# Patient Record
Sex: Female | Born: 1980 | Race: White | Hispanic: No | Marital: Married | Smoking: Former smoker
Health system: Southern US, Community
[De-identification: ages and names within clinical notes are randomized; demographics above are authoritative.]

## PROBLEM LIST (undated history)

## (undated) DIAGNOSIS — G43909 Migraine, unspecified, not intractable, without status migrainosus: Secondary | ICD-10-CM

## (undated) DIAGNOSIS — K589 Irritable bowel syndrome without diarrhea: Secondary | ICD-10-CM

## (undated) DIAGNOSIS — G629 Polyneuropathy, unspecified: Secondary | ICD-10-CM

## (undated) DIAGNOSIS — L900 Lichen sclerosus et atrophicus: Secondary | ICD-10-CM

## (undated) DIAGNOSIS — K219 Gastro-esophageal reflux disease without esophagitis: Secondary | ICD-10-CM

## (undated) DIAGNOSIS — G8929 Other chronic pain: Secondary | ICD-10-CM

## (undated) DIAGNOSIS — M549 Dorsalgia, unspecified: Secondary | ICD-10-CM

## (undated) HISTORY — DX: Dorsalgia, unspecified: M54.9

## (undated) HISTORY — DX: Other chronic pain: G89.29

## (undated) HISTORY — DX: Lichen sclerosus et atrophicus: L90.0

## (undated) HISTORY — DX: Irritable bowel syndrome, unspecified: K58.9

## (undated) HISTORY — DX: Migraine, unspecified, not intractable, without status migrainosus: G43.909

## (undated) HISTORY — DX: Gastro-esophageal reflux disease without esophagitis: K21.9

## (undated) HISTORY — DX: Polyneuropathy, unspecified: G62.9

---

## 2006-08-19 ENCOUNTER — Emergency Department (HOSPITAL_COMMUNITY): Admission: EM | Admit: 2006-08-19 | Discharge: 2006-08-19 | Payer: Self-pay | Admitting: Emergency Medicine

## 2014-11-30 HISTORY — PX: LUMBAR DISC SURGERY: SHX700

## 2015-11-23 ENCOUNTER — Other Ambulatory Visit: Payer: Self-pay | Admitting: Neurosurgery

## 2015-11-23 DIAGNOSIS — M545 Low back pain: Secondary | ICD-10-CM

## 2015-12-05 ENCOUNTER — Ambulatory Visit
Admission: RE | Admit: 2015-12-05 | Discharge: 2015-12-05 | Disposition: A | Discharge status: Home / Self Care | Payer: Federal, State, Local not specified - PPO | Source: Ambulatory Visit | Attending: Neurosurgery | Admitting: Neurosurgery

## 2015-12-05 DIAGNOSIS — M545 Low back pain: Secondary | ICD-10-CM

## 2015-12-05 MED ORDER — GADOBENATE DIMEGLUMINE 529 MG/ML IV SOLN
18.0000 mL | Freq: Once | INTRAVENOUS | Status: AC | PRN
  Administered 2015-12-05: 18 mL via INTRAVENOUS

## 2017-01-14 DIAGNOSIS — K080 Exfoliation of teeth due to systemic causes: Secondary | ICD-10-CM | POA: Diagnosis not present

## 2017-04-10 DIAGNOSIS — M545 Low back pain: Secondary | ICD-10-CM | POA: Diagnosis not present

## 2017-04-10 DIAGNOSIS — M5416 Radiculopathy, lumbar region: Secondary | ICD-10-CM | POA: Diagnosis not present

## 2017-04-10 DIAGNOSIS — M5417 Radiculopathy, lumbosacral region: Secondary | ICD-10-CM | POA: Diagnosis not present

## 2017-04-10 DIAGNOSIS — R030 Elevated blood-pressure reading, without diagnosis of hypertension: Secondary | ICD-10-CM | POA: Diagnosis not present

## 2017-04-21 DIAGNOSIS — D485 Neoplasm of uncertain behavior of skin: Secondary | ICD-10-CM | POA: Diagnosis not present

## 2017-04-21 DIAGNOSIS — Z0000 Encounter for general adult medical examination without abnormal findings: Secondary | ICD-10-CM | POA: Diagnosis not present

## 2017-04-24 DIAGNOSIS — D225 Melanocytic nevi of trunk: Secondary | ICD-10-CM | POA: Diagnosis not present

## 2017-05-06 DIAGNOSIS — D489 Neoplasm of uncertain behavior, unspecified: Secondary | ICD-10-CM | POA: Diagnosis not present

## 2017-05-15 DIAGNOSIS — D485 Neoplasm of uncertain behavior of skin: Secondary | ICD-10-CM | POA: Diagnosis not present

## 2017-05-29 DIAGNOSIS — D485 Neoplasm of uncertain behavior of skin: Secondary | ICD-10-CM | POA: Diagnosis not present

## 2017-06-13 DIAGNOSIS — D225 Melanocytic nevi of trunk: Secondary | ICD-10-CM | POA: Diagnosis not present

## 2017-06-13 DIAGNOSIS — D2239 Melanocytic nevi of other parts of face: Secondary | ICD-10-CM | POA: Diagnosis not present

## 2017-06-13 DIAGNOSIS — Z85820 Personal history of malignant melanoma of skin: Secondary | ICD-10-CM | POA: Diagnosis not present

## 2017-06-13 DIAGNOSIS — D485 Neoplasm of uncertain behavior of skin: Secondary | ICD-10-CM | POA: Diagnosis not present

## 2017-06-24 DIAGNOSIS — M545 Low back pain: Secondary | ICD-10-CM | POA: Diagnosis not present

## 2017-06-24 DIAGNOSIS — R51 Headache: Secondary | ICD-10-CM | POA: Diagnosis not present

## 2017-06-24 DIAGNOSIS — D039 Melanoma in situ, unspecified: Secondary | ICD-10-CM | POA: Diagnosis not present

## 2017-06-24 DIAGNOSIS — M25562 Pain in left knee: Secondary | ICD-10-CM | POA: Diagnosis not present

## 2017-06-26 DIAGNOSIS — L900 Lichen sclerosus et atrophicus: Secondary | ICD-10-CM | POA: Diagnosis not present

## 2017-07-21 DIAGNOSIS — K080 Exfoliation of teeth due to systemic causes: Secondary | ICD-10-CM | POA: Diagnosis not present

## 2017-07-25 DIAGNOSIS — L900 Lichen sclerosus et atrophicus: Secondary | ICD-10-CM | POA: Diagnosis not present

## 2017-08-08 DIAGNOSIS — L900 Lichen sclerosus et atrophicus: Secondary | ICD-10-CM | POA: Diagnosis not present

## 2017-08-12 DIAGNOSIS — R1013 Epigastric pain: Secondary | ICD-10-CM | POA: Diagnosis not present

## 2017-08-12 DIAGNOSIS — R938 Abnormal findings on diagnostic imaging of other specified body structures: Secondary | ICD-10-CM | POA: Diagnosis not present

## 2017-08-12 DIAGNOSIS — R197 Diarrhea, unspecified: Secondary | ICD-10-CM | POA: Diagnosis not present

## 2017-09-01 DIAGNOSIS — R630 Anorexia: Secondary | ICD-10-CM | POA: Diagnosis not present

## 2017-09-01 DIAGNOSIS — K5900 Constipation, unspecified: Secondary | ICD-10-CM | POA: Diagnosis not present

## 2017-09-01 DIAGNOSIS — K591 Functional diarrhea: Secondary | ICD-10-CM | POA: Diagnosis not present

## 2017-09-01 DIAGNOSIS — R1011 Right upper quadrant pain: Secondary | ICD-10-CM | POA: Diagnosis not present

## 2017-09-12 DIAGNOSIS — R1011 Right upper quadrant pain: Secondary | ICD-10-CM | POA: Diagnosis not present

## 2017-09-16 DIAGNOSIS — G43009 Migraine without aura, not intractable, without status migrainosus: Secondary | ICD-10-CM | POA: Diagnosis not present

## 2017-10-03 DIAGNOSIS — R630 Anorexia: Secondary | ICD-10-CM | POA: Diagnosis not present

## 2017-10-03 DIAGNOSIS — R1013 Epigastric pain: Secondary | ICD-10-CM | POA: Diagnosis not present

## 2017-10-03 DIAGNOSIS — R1011 Right upper quadrant pain: Secondary | ICD-10-CM | POA: Diagnosis not present

## 2017-10-03 DIAGNOSIS — K5900 Constipation, unspecified: Secondary | ICD-10-CM | POA: Diagnosis not present

## 2017-10-03 DIAGNOSIS — R110 Nausea: Secondary | ICD-10-CM | POA: Diagnosis not present

## 2017-10-03 DIAGNOSIS — Z800 Family history of malignant neoplasm of digestive organs: Secondary | ICD-10-CM | POA: Diagnosis not present

## 2017-10-03 DIAGNOSIS — Z3202 Encounter for pregnancy test, result negative: Secondary | ICD-10-CM | POA: Diagnosis not present

## 2017-10-03 DIAGNOSIS — R197 Diarrhea, unspecified: Secondary | ICD-10-CM | POA: Diagnosis not present

## 2017-10-08 DIAGNOSIS — Z6829 Body mass index (BMI) 29.0-29.9, adult: Secondary | ICD-10-CM | POA: Diagnosis not present

## 2017-10-08 DIAGNOSIS — R030 Elevated blood-pressure reading, without diagnosis of hypertension: Secondary | ICD-10-CM | POA: Diagnosis not present

## 2017-10-08 DIAGNOSIS — M545 Low back pain: Secondary | ICD-10-CM | POA: Diagnosis not present

## 2017-10-08 DIAGNOSIS — M5416 Radiculopathy, lumbar region: Secondary | ICD-10-CM | POA: Diagnosis not present

## 2017-11-10 ENCOUNTER — Telehealth: Payer: Self-pay | Admitting: *Deleted

## 2017-11-10 NOTE — Telephone Encounter (Signed)
Left message on patient's voicemail letting her know our office will be closed due to inclement weather.  Provided our number to call back to reschedule.  Since she will be a new patient, she can be scheduled with the first available, appropriate provider. 

## 2017-11-11 ENCOUNTER — Ambulatory Visit: Payer: Federal, State, Local not specified - PPO | Admitting: Neurology

## 2017-11-12 NOTE — Telephone Encounter (Addendum)
Pt called back an appt has been scheduled with Dr Marjory LiesPenumalli 11/14/17  FYI

## 2017-11-13 ENCOUNTER — Other Ambulatory Visit: Payer: Self-pay | Admitting: *Deleted

## 2017-11-13 ENCOUNTER — Encounter: Payer: Self-pay | Admitting: *Deleted

## 2017-11-14 ENCOUNTER — Encounter: Payer: Self-pay | Admitting: Diagnostic Neuroimaging

## 2017-11-14 ENCOUNTER — Ambulatory Visit: Payer: Federal, State, Local not specified - PPO | Admitting: Diagnostic Neuroimaging

## 2017-11-14 VITALS — BP 120/79 | HR 72 | Ht 69.0 in | Wt 194.0 lb

## 2017-11-14 DIAGNOSIS — G43009 Migraine without aura, not intractable, without status migrainosus: Secondary | ICD-10-CM | POA: Diagnosis not present

## 2017-11-14 MED ORDER — TOPIRAMATE 50 MG PO TABS: 50.0000 mg | ORAL_TABLET | Freq: Two times a day (BID) | ORAL | 12 refills | Status: DC

## 2017-11-14 MED ORDER — RIZATRIPTAN BENZOATE 10 MG PO TBDP: 10.0000 mg | ORAL_TABLET | ORAL | 11 refills | Status: DC | PRN

## 2017-11-14 NOTE — Progress Notes (Signed)
[facility]  PATIENT: Ashley DingwallHeather N Lucero DOB: [birth year]  REFERRING CLINICIAN: Slatosky HISTORY FROM: patient  REASON FOR VISIT: new consult    HISTORICAL  CHIEF COMPLAINT:  Chief Complaint  Patient presents with  . Migraine    rm 7, New Pt, "migraines for about 2 years    HISTORY OF PRESENT ILLNESS:   36 year old female here for evaluation of headaches and migraines.  Patient has history of lichen sclerosis, irritable bowel syndrome, chronic low back pain.  For past 2 years patient reports new onset of retro-orbital headaches, pain, with pressure sensation, progressing to her entire head.  Headaches can last hours at a time.  She has at least 2 headaches per week.  Headache severity ranges from 8-9 out of 10 in severity.  She has associated nausea, photophobia, but no sensitivity to smells or sounds.  No specific triggering factors.  She is tried Tylenol and ice packs, laying down in a dark room with mild relief.  She was started on topiramate at least 1 year ago with mild benefit.  No family history of migraine.   REVIEW OF SYSTEMS: Full 14 system review of systems performed and negative with exception of: Fatigue hearing loss ringing in ears joint pain cramps headache numbness sleepiness.  ALLERGIES: Allergies  Allergen Reactions  . Prednisone Other (See Comments)    Vision difficulty    HOME MEDICATIONS: Outpatient Medications Prior to Visit  Medication Sig Dispense Refill  . CARAFATE 1 GM/10ML suspension 500 mg 4 x daily    . Clobetasol Propionate (CLOBETASOL 17 PROPIONATE) 0.5 % POWD by Does not apply route.    . cyclobenzaprine (FLEXERIL) 10 MG tablet Take 10 mg by mouth 2 (two) times daily as needed.  2  . docusate sodium (COLACE) 50 MG capsule Take 50 mg by mouth daily.    . DULoxetine (CYMBALTA) 30 MG capsule Take 30 mg by mouth 2 (two) times daily.  5  . loratadine (CLARITIN) 10 MG tablet Take 10 mg by mouth daily.    . meloxicam (MOBIC) 15 MG  tablet Take 15 mg by mouth daily.    Marland Kitchen. omeprazole (PRILOSEC) 20 MG capsule Take 20 mg by mouth daily.    . Pimecrolimus (ELIDEL EX) Apply 30 g topically.    . topiramate (TOPAMAX) 50 MG tablet Take 50 mg by mouth 2 (two) times daily.  5  . sucralfate (CARAFATE) 1 GM/10ML suspension Take 1 g by mouth 4 (four) times daily -  with meals and at bedtime.     No facility-administered medications prior to visit.     PAST MEDICAL HISTORY: Past Medical History:  Diagnosis Date  . Chronic back pain   . GERD (gastroesophageal reflux disease)   . IBS (irritable bowel syndrome)   . Lichen sclerosus   . Migraine   . Neuropathy     PAST SURGICAL HISTORY: Past Surgical History:  Procedure Laterality Date  . LUMBAR DISC SURGERY  11/30/2014   L5-S1    FAMILY HISTORY: Family History  Problem Relation Age of Onset  . Diabetes Mother   . Hypertension Maternal Grandmother   . Thyroid disease Maternal Grandmother   . Diabetes Mellitus II Maternal Grandfather   . Lung cancer Maternal Grandfather   . CAD Paternal Grandmother   . Heart disease Paternal Grandmother   . Skin cancer Paternal Grandfather   . Leukemia Paternal Grandfather     SOCIAL HISTORY:  Social History   Socioeconomic History  . Marital status:  Married    Spouse name: Ashley Lucero  . Number of children: 0  . Years of education: 16 BS  . Highest education level: Not on file  Social Needs  . Financial resource strain: Not on file  . Food insecurity - worry: Not on file  . Food insecurity - inability: Not on file  . Transportation needs - medical: Not on file  . Transportation needs - non-medical: Not on file  Occupational History    Comment: [employer]  Tobacco Use  . Smoking status: Former Smoker    Last attempt to quit: 11/14/2012    Years since quitting: 5.0  . Smokeless tobacco: Never Used  Substance and Sexual Activity  . Alcohol use: Yes    Comment: 2-3 weekly  . Drug use: No  . Sexual activity: Not on file  Other  Topics Concern  . Not on file  Social History Narrative   Married   caffeine coffee, 3 cups daily     PHYSICAL EXAM  GENERAL EXAM/CONSTITUTIONAL: Vitals:  Vitals:   11/14/17 0811  BP: 120/79  Pulse: 72  Weight: 194 lb (88 kg)  Height: 5\' 9"  (1.753 m)     Body mass index is 28.65 kg/m.  Visual Acuity Screening   Right eye Left eye Both eyes  Without correction: 20/30 20/50   With correction:        Patient is in no distress; well developed, nourished and groomed; neck is supple  CARDIOVASCULAR:  Examination of carotid arteries is normal; no carotid bruits  Regular rate and rhythm, no murmurs  Examination of peripheral vascular system by observation and palpation is normal  EYES:  Ophthalmoscopic exam of optic discs and posterior segments is normal; no papilledema or hemorrhages  MUSCULOSKELETAL:  Gait, strength, tone, movements noted in Neurologic exam below  NEUROLOGIC: MENTAL STATUS:  No flowsheet data found.  awake, alert, oriented to person, place and time  recent and remote memory intact  normal attention and concentration  language fluent, comprehension intact, naming intact,   fund of knowledge appropriate  CRANIAL NERVE:   2nd - no papilledema on fundoscopic exam  2nd, 3rd, 4th, 6th - pupils equal and reactive to light, visual fields full to confrontation, extraocular muscles intact, no nystagmus  5th - facial sensation symmetric  7th - facial strength symmetric  8th - hearing intact  9th - palate elevates symmetrically, uvula midline  11th - shoulder shrug symmetric  12th - tongue protrusion midline  MOTOR:   normal bulk and tone, full strength in the BUE, LLE  RLE 4  SENSORY:   normal and symmetric to light touch, temperature, vibration  DECR IN RIGHT LEG  COORDINATION:   finger-nose-finger, fine finger movements normal  REFLEXES:   deep tendon reflexes present and symmetric; EXCEPT ABSENT IN RIGHT  ANKLE  GAIT/STATION:   narrow based gait; able to walk on toes, heels and tandem; romberg is negative    DIAGNOSTIC DATA (LABS, IMAGING, TESTING) - I reviewed patient records, labs, notes, testing and imaging myself where available.  No results found for: WBC, HGB, HCT, MCV, PLT No results found for: NA, K, CL, CO2, GLUCOSE, BUN, CREATININE, CALCIUM, PROT, ALBUMIN, AST, ALT, ALKPHOS, BILITOT, GFRNONAA, GFRAA No results found for: CHOL, HDL, LDLCALC, LDLDIRECT, TRIG, CHOLHDL No results found for: RUEA5WHGBA1C No results found for: VITAMINB12 No results found for: TSH   08/20/06 MRI brain [I reviewed images myself and agree with interpretation. -[facility]P]  - normal  07/25/16 MRI brain [I reviewed images  myself and agree with interpretation. -[facility]P]  - normal    ASSESSMENT AND PLAN  36 y.o. year old female here with history of headaches since 2016 with migraine features.   Dx:  1. Migraine without aura and without status migrainosus, not intractable      PLAN:  - increase topiramate to 50mg  in AM and 100mg  in PM; may increase to 100mg  twice a day after 4 weeks; drink plenty of water   - start rizatriptan 10mg  as needed for breakthrough headache; may repeat x 1 after 2 hours; max 2 tabs per day or 8 per month  - To prevent or relieve headaches, try the following:   Cool Compress. Lie down and place a cool compress on your head.   Avoid headache triggers. If certain foods or odors seem to have triggered your migraines in the past, avoid them. A headache diary might help you identify triggers.   Include physical activity in your daily routine.   Manage stress. Find healthy ways to cope with the stressors, such as delegating tasks on your to-do list.   Practice relaxation techniques. Try deep breathing, yoga, massage and visualization.   Eat regularly. Eating regularly scheduled meals and maintaining a healthy diet might help prevent headaches. Also, drink plenty of fluids.    Follow a regular sleep schedule. Sleep deprivation might contribute to headaches  Consider biofeedback. With this mind-body technique, you learn to control certain bodily functions - such as muscle tension, heart rate and blood pressure - to prevent headaches or reduce headache pain.  Meds ordered this encounter  Medications  . topiramate (TOPAMAX) 50 MG tablet    Sig: Take 1-2 tablets (50-100 mg total) by mouth 2 (two) times daily. 5    Dispense:  120 tablet    Refill:  12  . rizatriptan (MAXALT-MLT) 10 MG disintegrating tablet    Sig: Take 1 tablet (10 mg total) by mouth as needed for migraine. May repeat in 2 hours if needed    Dispense:  9 tablet    Refill:  11   Return in about 4 months (around 03/15/2018) for with NP/PA or Silver Parkey.    Suanne Marker, MD 11/14/2017, 8:36 AM Certified in Neurology, Neurophysiology and Neuroimaging  [facility] [address], [address] [place], [place] [address] [phone]

## 2017-11-14 NOTE — Patient Instructions (Signed)
Thank you for coming to see [place] at [facility]. I hope we have been able to provide you high quality care today.  You may receive a patient satisfaction survey over the next few weeks. We would appreciate your feedback and comments so that we may continue to improve ourselves and the health of our patients.  - increase topiramate to 24m in AM and 1065min PM; drink plenty of water   - start rizatriptan 1058ms needed for breakthrough headache; may repeat x 1 after 2 hours; max 2 tabs per day or 8 per month  - To prevent or relieve headaches, try the following:   Cool Compress. Lie down and place a cool compress on your head.   Avoid headache triggers. If certain foods or odors seem to have triggered your migraines in the past, avoid them. A headache diary might help you identify triggers.   Include physical activity in your daily routine.   Manage stress. Find healthy ways to cope with the stressors, such as delegating tasks on your to-do list.   Practice relaxation techniques. Try deep breathing, yoga, massage and visualization.   Eat regularly. Eating regularly scheduled meals and maintaining a healthy diet might help prevent headaches. Also, drink plenty of fluids.   Follow a regular sleep schedule. Sleep deprivation might contribute to headaches  Consider biofeedback. With this mind-body technique, you learn to control certain bodily functions - such as muscle tension, heart rate and blood pressure - to prevent headaches or reduce headache pain.     ~~~~~~~~~~~~~~~~~~~~~~~~~~~~~~~~~~~~~~~~~~~~~~~~~~~~~~~~~~~~~~~~~  DR. Cendy Oconnor'S GUIDE TO HAPPY AND HEALTHY LIVING These are some of my general health and wellness recommendations. Some of them may apply to you better than others. Please use common sense as you try these suggestions and feel free to ask me any questions.   ACTIVITY/FITNESS Mental, social, emotional and physical stimulation are very  important for brain and body health. Try learning a new activity (arts, music, language, sports, games).  Keep moving your body to the best of your abilities. You can do this at home, inside or outside, the park, community center, gym or anywhere you like. Consider a physical therapist or personal trainer to get started. Consider the app Sworkit. Fitness trackers such as smart-watches, smart-phones or Fitbits can help as well.   NUTRITION Eat more plants: colorful vegetables, nuts, seeds and berries.  Eat less sugar, salt, preservatives and processed foods.  Avoid toxins such as cigarettes and alcohol.  Drink water when you are thirsty. Warm water with a slice of lemon is an excellent morning drink to start the day.  Consider these websites for more information The Nutrition Source (htthttps://www.henry-hernandez.biz/recision Nutrition (wwwWindowBlog.ch RELAXATION Consider practicing mindfulness meditation or other relaxation techniques such as deep breathing, prayer, yoga, tai chi, massage. See website mindful.org or the apps Headspace or Calm to help get started.   SLEEP Try to get at least 7-8+ hours sleep per day. Regular exercise and reduced caffeine will help you sleep better. Practice good sleep hygeine techniques. See website sleep.org for more information.   PLANNING Prepare estate planning, living will, healthcare POA documents. Sometimes this is best planned with the help of an attorney. Theconversationproject.org and agingwithdignity.org are excellent resources.

## 2017-11-17 DIAGNOSIS — M25512 Pain in left shoulder: Secondary | ICD-10-CM | POA: Diagnosis not present

## 2017-12-05 DIAGNOSIS — R9389 Abnormal findings on diagnostic imaging of other specified body structures: Secondary | ICD-10-CM | POA: Diagnosis not present

## 2017-12-05 DIAGNOSIS — D251 Intramural leiomyoma of uterus: Secondary | ICD-10-CM | POA: Diagnosis not present

## 2017-12-05 DIAGNOSIS — N8301 Follicular cyst of right ovary: Secondary | ICD-10-CM | POA: Diagnosis not present

## 2017-12-11 DIAGNOSIS — D485 Neoplasm of uncertain behavior of skin: Secondary | ICD-10-CM | POA: Diagnosis not present

## 2018-01-30 DIAGNOSIS — K080 Exfoliation of teeth due to systemic causes: Secondary | ICD-10-CM | POA: Diagnosis not present

## 2018-03-09 DIAGNOSIS — M545 Low back pain: Secondary | ICD-10-CM | POA: Diagnosis not present

## 2018-03-09 DIAGNOSIS — Z6830 Body mass index (BMI) 30.0-30.9, adult: Secondary | ICD-10-CM | POA: Diagnosis not present

## 2018-03-09 DIAGNOSIS — M5416 Radiculopathy, lumbar region: Secondary | ICD-10-CM | POA: Diagnosis not present

## 2018-03-12 NOTE — Progress Notes (Signed)
[facility]  PATIENT: Ashley Lucero DOB: 1981-02-21   REASON FOR VISIT: Follow-up for migraine HISTORY FROM: Patient    HISTORY OF PRESENT ILLNESS:UPDATE 4/12/2019CM Ms. [place], 37 year old female returns for follow-up with history of migraine.  She is currently on Topamax 50 in the morning 100 mg at night.  She has had one migraine in the last month.  She has not had to use her acute medication and she took Tylenol and it went away.  She also has seasonal allergies and weather changes are triggers for her.  She has no sensitivity to smells and sounds but can have nausea.  She has not missed any work.  She returns for reevaluation.    12/14/18VP 37 year old female here for evaluation of headaches and migraines.  Patient has history of lichen sclerosis, irritable bowel syndrome, chronic low back pain.  For past 2 years patient reports new onset of retro-orbital headaches, pain, with pressure sensation, progressing to her entire head.  Headaches can last hours at a time.  She has at least 2 headaches per week.  Headache severity ranges from 8-9 out of 10 in severity.  She has associated nausea, photophobia, but no sensitivity to smells or sounds.  No specific triggering factors.  She is tried Tylenol and ice packs, laying down in a dark room with mild relief.  She was started on topiramate at least 1 year ago with mild benefit.  No family history of migraine.    REVIEW OF SYSTEMS: Full 14 system review of systems performed and notable only for those listed, all others are neg:  Constitutional: Fatigue Cardiovascular: neg Ear/Nose/Throat: Runny nose Skin: neg Eyes: neg Respiratory: neg Gastroitestinal: neg  Hematology/Lymphatic: neg  Endocrine: neg Musculoskeletal:neg Allergy/Immunology: Environmental allergies Neurological: Headaches Psychiatric: neg Sleep : neg   ALLERGIES: Allergies  Allergen Reactions  . Prednisone Other (See Comments)    Vision  difficulty    HOME MEDICATIONS: Outpatient Medications Prior to Visit  Medication Sig Dispense Refill  . CARAFATE 1 GM/10ML suspension 500 mg 4 x daily    . Clobetasol Propionate (CLOBETASOL 17 PROPIONATE) 0.5 % POWD by Does not apply route.    . cyclobenzaprine (FLEXERIL) 10 MG tablet Take 10 mg by mouth 2 (two) times daily as needed.  2  . docusate sodium (COLACE) 50 MG capsule Take 50 mg by mouth daily.    . DULoxetine (CYMBALTA) 30 MG capsule Take 30 mg by mouth 2 (two) times daily.  5  . loratadine (CLARITIN) 10 MG tablet Take 10 mg by mouth daily.    . meloxicam (MOBIC) 15 MG tablet Take 15 mg by mouth daily.    Marland Kitchen omeprazole (PRILOSEC) 20 MG capsule Take 20 mg by mouth daily.    . Pimecrolimus (ELIDEL EX) Apply 30 g topically.    . rizatriptan (MAXALT-MLT) 10 MG disintegrating tablet Take 1 tablet (10 mg total) by mouth as needed for migraine. May repeat in 2 hours if needed 9 tablet 11  . topiramate (TOPAMAX) 50 MG tablet Take 1-2 tablets (50-100 mg total) by mouth 2 (two) times daily. 5 120 tablet 12   No facility-administered medications prior to visit.     PAST MEDICAL HISTORY: Past Medical History:  Diagnosis Date  . Chronic back pain   . GERD (gastroesophageal reflux disease)   . IBS (irritable bowel syndrome)   . Lichen sclerosus   . Migraine   . Neuropathy     PAST SURGICAL HISTORY: Past Surgical History:  Procedure Laterality Date  . LUMBAR DISC SURGERY  11/30/2014   L5-S1    FAMILY HISTORY: Family History  Problem Relation Age of Onset  . Diabetes Mother   . Hypertension Maternal Grandmother   . Thyroid disease Maternal Grandmother   . Diabetes Mellitus II Maternal Grandfather   . Lung cancer Maternal Grandfather   . CAD Paternal Grandmother   . Heart disease Paternal Grandmother   . Skin cancer Paternal Grandfather   . Leukemia Paternal Grandfather     SOCIAL HISTORY: Social History   Socioeconomic History  . Marital status: Married    Spouse  name: Lysle Pearl  . Number of children: 0  . Years of education: 16 BS  . Highest education level: Not on file  Occupational History    Comment: [employer]  Social Needs  . Financial resource strain: Not on file  . Food insecurity:    Worry: Not on file    Inability: Not on file  . Transportation needs:    Medical: Not on file    Non-medical: Not on file  Tobacco Use  . Smoking status: Former Smoker    Last attempt to quit: 11/14/2012    Years since quitting: 5.3  . Smokeless tobacco: Never Used  Substance and Sexual Activity  . Alcohol use: Yes    Comment: 2-3 weekly  . Drug use: No  . Sexual activity: Not on file  Lifestyle  . Physical activity:    Days per week: Not on file    Minutes per session: Not on file  . Stress: Not on file  Relationships  . Social connections:    Talks on phone: Not on file    Gets together: Not on file    Attends religious service: Not on file    Active member of club or organization: Not on file    Attends meetings of clubs or organizations: Not on file    Relationship status: Not on file  . Intimate partner violence:    Fear of current or ex partner: Not on file    Emotionally abused: Not on file    Physically abused: Not on file    Forced sexual activity: Not on file  Other Topics Concern  . Not on file  Social History Narrative   Married   caffeine coffee, 3 cups daily     PHYSICAL EXAM  Vitals:   03/13/18 1038  BP: 113/78  Pulse: 78  Weight: 202 lb (91.6 kg)  Height: 5\' 9"  (1.753 m)   Body mass index is 29.83 kg/m.  Generalized: Well developed, in no acute distress  Head: normocephalic and atraumatic,. Oropharynx benign  Neck: Supple,  Musculoskeletal: No deformity   Neurological examination   Mentation: Alert oriented to time, place, history taking. Attention span and concentration appropriate. Recent and remote memory intact.  Follows all commands speech and language fluent.   Cranial nerve II-XII: Fundoscopic exam  reveals sharp disc margins.Pupils were equal round reactive to light extraocular movements were full, visual field were full on confrontational test. Facial sensation and strength were normal. hearing was intact to finger rubbing bilaterally. Uvula tongue midline. head turning and shoulder shrug were normal and symmetric.Tongue protrusion into cheek strength was normal. Motor: normal bulk and tone, full strength in the BUE, BLE, Sensory: normal and symmetric to light touch,  Coordination: finger-nose-finger, heel-to-shin bilaterally, no dysmetria Reflexes: Brachioradialis 2/2, biceps 2/2, triceps 2/2, patellar 2/2, Achilles 2/2, plantar responses were flexor bilaterally. Gait and Station: Rising up from  seated position without assistance, normal stance,  moderate stride, good arm swing, smooth turning, able to perform tiptoe, and heel walking without difficulty. Tandem gait is steady  DIAGNOSTIC DATA (LABS, IMAGING, TESTING) - I reviewed patient records, labs, notes, testing and imaging myself where available.   ASSESSMENT AND PLAN  37 y.o. year old female  has a past medical history of Chronic back pain, GERD (gastroesophageal reflux disease), IBS (irritable bowel syndrome), Lichen sclerosus, Migraine, and Neuropathy. here to follow-up for migraine without aura.  Her headaches are in excellent control at present.   PLAN:Continue Topamax 50 mg in the morning 100 mg at night If headaches worsen migraine tracker to record,  Avoid migraine triggers if possible Try to get physical activity every day, manage her stress follow-up in 6-8 months Nilda RiggsNancy Carolyn Jocilynn Grade, [facility] CenterGNP, [facility] CenterBC, APRN  [facility] [address], [address] [place], [place] [address] [phone]

## 2018-03-13 ENCOUNTER — Encounter (INDEPENDENT_AMBULATORY_CARE_PROVIDER_SITE_OTHER): Payer: Self-pay

## 2018-03-13 ENCOUNTER — Encounter: Payer: Self-pay | Admitting: Nurse Practitioner

## 2018-03-13 ENCOUNTER — Ambulatory Visit: Payer: Federal, State, Local not specified - PPO | Admitting: Nurse Practitioner

## 2018-03-13 DIAGNOSIS — G43009 Migraine without aura, not intractable, without status migrainosus: Secondary | ICD-10-CM

## 2018-03-13 NOTE — Patient Instructions (Signed)
Continue Topamax 50 mg in the morning 100 mg at night If headaches worsen migraine tracker to record,  follow-up in 6-8 months

## 2018-04-29 DIAGNOSIS — M25512 Pain in left shoulder: Secondary | ICD-10-CM | POA: Diagnosis not present

## 2018-05-07 DIAGNOSIS — N83201 Unspecified ovarian cyst, right side: Secondary | ICD-10-CM | POA: Diagnosis not present

## 2018-05-07 DIAGNOSIS — N838 Other noninflammatory disorders of ovary, fallopian tube and broad ligament: Secondary | ICD-10-CM | POA: Diagnosis not present

## 2018-06-08 DIAGNOSIS — M545 Low back pain: Secondary | ICD-10-CM | POA: Diagnosis not present

## 2018-06-08 DIAGNOSIS — M5416 Radiculopathy, lumbar region: Secondary | ICD-10-CM | POA: Diagnosis not present

## 2018-06-08 DIAGNOSIS — R030 Elevated blood-pressure reading, without diagnosis of hypertension: Secondary | ICD-10-CM | POA: Diagnosis not present

## 2018-06-09 DIAGNOSIS — Z0000 Encounter for general adult medical examination without abnormal findings: Secondary | ICD-10-CM | POA: Diagnosis not present

## 2018-06-09 DIAGNOSIS — D259 Leiomyoma of uterus, unspecified: Secondary | ICD-10-CM | POA: Diagnosis not present

## 2018-06-09 DIAGNOSIS — N200 Calculus of kidney: Secondary | ICD-10-CM | POA: Diagnosis not present

## 2018-06-09 DIAGNOSIS — Z131 Encounter for screening for diabetes mellitus: Secondary | ICD-10-CM | POA: Diagnosis not present

## 2018-06-09 DIAGNOSIS — Z13220 Encounter for screening for lipoid disorders: Secondary | ICD-10-CM | POA: Diagnosis not present

## 2018-06-09 DIAGNOSIS — Z1321 Encounter for screening for nutritional disorder: Secondary | ICD-10-CM | POA: Diagnosis not present

## 2018-06-11 DIAGNOSIS — D485 Neoplasm of uncertain behavior of skin: Secondary | ICD-10-CM | POA: Diagnosis not present

## 2018-06-11 DIAGNOSIS — D2239 Melanocytic nevi of other parts of face: Secondary | ICD-10-CM | POA: Diagnosis not present

## 2018-06-11 DIAGNOSIS — Z85820 Personal history of malignant melanoma of skin: Secondary | ICD-10-CM | POA: Diagnosis not present

## 2018-06-11 DIAGNOSIS — L814 Other melanin hyperpigmentation: Secondary | ICD-10-CM | POA: Diagnosis not present

## 2018-06-11 DIAGNOSIS — D225 Melanocytic nevi of trunk: Secondary | ICD-10-CM | POA: Diagnosis not present

## 2018-06-26 DIAGNOSIS — M25512 Pain in left shoulder: Secondary | ICD-10-CM | POA: Diagnosis not present

## 2018-07-03 DIAGNOSIS — Z5189 Encounter for other specified aftercare: Secondary | ICD-10-CM | POA: Diagnosis not present

## 2018-07-03 DIAGNOSIS — M25512 Pain in left shoulder: Secondary | ICD-10-CM | POA: Diagnosis not present

## 2018-07-17 DIAGNOSIS — M25512 Pain in left shoulder: Secondary | ICD-10-CM | POA: Diagnosis not present

## 2018-08-07 DIAGNOSIS — K080 Exfoliation of teeth due to systemic causes: Secondary | ICD-10-CM | POA: Diagnosis not present

## 2018-08-10 DIAGNOSIS — K080 Exfoliation of teeth due to systemic causes: Secondary | ICD-10-CM | POA: Diagnosis not present

## 2018-10-21 NOTE — Progress Notes (Signed)
[facility]  PATIENT: Ashley Lucero DOB: 1981-01-28   REASON FOR VISIT: Follow-up for migraine HISTORY FROM: Patient    HISTORY OF PRESENT ILLNESS:UPDATE 11/22/19CM Ashley Lucero, 37 year old female returns for follow-up with history of migraine headaches.  She is currently on Topamax 50 in the morning and 100 at night she takes Maxalt acutely.  She can  more headaches around her menstrual cycle.  Weather changes or triggers for her.  No sensitivity to smells or sounds.  No missed work.  Recently diagnosed with PTSD by the [place].  She also has a history of chronic back pain and is on Cymbalta and Flexeril.  She returns for reevaluation   UPDATE 4/12/2019CM Ashley Lucero, 37 year old female returns for follow-up with history of migraine.  She is currently on Topamax 50 in the morning 100 mg at night.  She has had one migraine in the last month.  She has not had to use her acute medication and she took Tylenol and it went away.  She also has seasonal allergies and weather changes are triggers for her.  She has no sensitivity to smells and sounds but can have nausea.  She has not missed any work.  She returns for reevaluation.    12/14/18VP 37 year old female here for evaluation of headaches and migraines.  Patient has history of lichen sclerosis, irritable bowel syndrome, chronic low back pain.  For past 2 years patient reports new onset of retro-orbital headaches, pain, with pressure sensation, progressing to her entire head.  Headaches can last hours at a time.  She has at least 2 headaches per week.  Headache severity ranges from 8-9 out of 10 in severity.  She has associated nausea, photophobia, but no sensitivity to smells or sounds.  No specific triggering factors.  She is tried Tylenol and ice packs, laying down in a dark room with mild relief.  She was started on topiramate at least 1 year ago with mild benefit.  No family history of migraine.    REVIEW OF SYSTEMS: Full 14  system review of systems performed and notable only for those listed, all others are neg:  Constitutional: Fatigue Cardiovascular: neg Ear/Nose/Throat: Runny nose Skin: neg Eyes: neg Respiratory: neg Gastroitestinal: neg  Hematology/Lymphatic: neg  Endocrine: neg Musculoskeletal: chronic back pain Allergy/Immunology: Environmental allergies Neurological: Headaches Psychiatric: PTSD Sleep : neg   ALLERGIES: Allergies  Allergen Reactions  . Prednisone Other (See Comments)    Vision difficulty    HOME MEDICATIONS: Outpatient Medications Prior to Visit  Medication Sig Dispense Refill  . CARAFATE 1 GM/10ML suspension 500 mg 4 x daily    . Clobetasol Propionate (CLOBETASOL 17 PROPIONATE) 0.5 % POWD by Does not apply route.    . cyclobenzaprine (FLEXERIL) 10 MG tablet Take 10 mg by mouth 2 (two) times daily as needed.  2  . docusate sodium (COLACE) 50 MG capsule Take 50 mg by mouth daily.    . DULoxetine (CYMBALTA) 30 MG capsule Take 30 mg by mouth 2 (two) times daily.  5  . loratadine (CLARITIN) 10 MG tablet Take 10 mg by mouth daily.    . meloxicam (MOBIC) 15 MG tablet Take 15 mg by mouth daily.    Marland Kitchen omeprazole (PRILOSEC) 20 MG capsule Take 20 mg by mouth daily.    . Pimecrolimus (ELIDEL EX) Apply 30 g topically.    . rizatriptan (MAXALT-MLT) 10 MG disintegrating tablet Take 1 tablet (10 mg total) by mouth as needed for migraine. May repeat in  2 hours if needed 9 tablet 11  . topiramate (TOPAMAX) 50 MG tablet Take 1-2 tablets (50-100 mg total) by mouth 2 (two) times daily. 5 120 tablet 12   No facility-administered medications prior to visit.     PAST MEDICAL HISTORY: Past Medical History:  Diagnosis Date  . Chronic back pain   . GERD (gastroesophageal reflux disease)   . IBS (irritable bowel syndrome)   . Lichen sclerosus   . Migraine   . Neuropathy     PAST SURGICAL HISTORY: Past Surgical History:  Procedure Laterality Date  . LUMBAR DISC SURGERY  11/30/2014    L5-S1    FAMILY HISTORY: Family History  Problem Relation Age of Onset  . Diabetes Mother   . Hypertension Maternal Grandmother   . Thyroid disease Maternal Grandmother   . Diabetes Mellitus II Maternal Grandfather   . Lung cancer Maternal Grandfather   . CAD Paternal Grandmother   . Heart disease Paternal Grandmother   . Skin cancer Paternal Grandfather   . Leukemia Paternal Grandfather     SOCIAL HISTORY: Social History   Socioeconomic History  . Marital status: Married    Spouse name: Lysle Pearl  . Number of children: 0  . Years of education: 16 BS  . Highest education level: Not on file  Occupational History    Comment: [employer]  Social Needs  . Financial resource strain: Not on file  . Food insecurity:    Worry: Not on file    Inability: Not on file  . Transportation needs:    Medical: Not on file    Non-medical: Not on file  Tobacco Use  . Smoking status: Former Smoker    Last attempt to quit: 11/14/2012    Years since quitting: 5.9  . Smokeless tobacco: Never Used  Substance and Sexual Activity  . Alcohol use: Yes    Comment: 2-3 weekly  . Drug use: No  . Sexual activity: Not on file  Lifestyle  . Physical activity:    Days per week: Not on file    Minutes per session: Not on file  . Stress: Not on file  Relationships  . Social connections:    Talks on phone: Not on file    Gets together: Not on file    Attends religious service: Not on file    Active member of club or organization: Not on file    Attends meetings of clubs or organizations: Not on file    Relationship status: Not on file  . Intimate partner violence:    Fear of current or ex partner: Not on file    Emotionally abused: Not on file    Physically abused: Not on file    Forced sexual activity: Not on file  Other Topics Concern  . Not on file  Social History Narrative   Married   caffeine coffee, 3 cups daily     PHYSICAL EXAM  Vitals:   10/23/18 1003  BP: 129/79  Pulse: 79    Weight: 205 lb 12.8 oz (93.4 kg)  Height: 5\' 9"  (1.753 m)   Body mass index is 30.39 kg/m.  Generalized: Well developed, obese female in no acute distress  Head: normocephalic and atraumatic,. Oropharynx benign  Neck: Supple,  Musculoskeletal: No deformity   Neurological examination   Mentation: Alert oriented to time, place, history taking. Attention span and concentration appropriate. Recent and remote memory intact.  Follows all commands speech and language fluent.   Cranial nerve II-XII: Pupils were  equal round reactive to light extraocular movements were full, visual field were full on confrontational test. Facial sensation and strength were normal. hearing was intact to finger rubbing bilaterally. Uvula tongue midline. head turning and shoulder shrug were normal and symmetric.Tongue protrusion into cheek strength was normal. Motor: normal bulk and tone, full strength in the BUE, BLE, Sensory: normal and symmetric to light touch,  Coordination: finger-nose-finger, heel-to-shin bilaterally, no dysmetria Reflexes: Symmetric upper and lower, plantar responses were flexor bilaterally. Gait and Station: Rising up from seated position without assistance, normal stance,  moderate stride, good arm swing, smooth turning, able to perform tiptoe, and heel walking without difficulty. Tandem gait is steady  DIAGNOSTIC DATA (LABS, IMAGING, TESTING) - I reviewed patient records, labs, notes, testing and imaging myself where available.   ASSESSMENT AND PLAN  37 y.o. year old female  has a past medical history of Chronic back pain, GERD (gastroesophageal reflux disease), IBS (irritable bowel syndrome), Lichen sclerosus, Migraine, and Neuropathy. here to follow-up for migraine without aura.  Her headaches are in good  control at present.   PLAN:Continue Topamax 50 mg in the morning 100 mg at night If headaches worsen migraine tracker APP to record,  Avoid migraine triggers if possible Try to  get physical activity every day, manage  stress follow-up yearly Nilda RiggsNancy Carolyn Elnoria Livingston, [facility] CenterGNP, [facility] CenterBC, APRN  [facility] [address], [address] [place], [place] [address] [phone]

## 2018-10-23 ENCOUNTER — Encounter: Payer: Self-pay | Admitting: Nurse Practitioner

## 2018-10-23 ENCOUNTER — Ambulatory Visit: Payer: Federal, State, Local not specified - PPO | Admitting: Nurse Practitioner

## 2018-10-23 VITALS — BP 129/79 | HR 79 | Ht 69.0 in | Wt 205.8 lb

## 2018-10-23 DIAGNOSIS — G43009 Migraine without aura, not intractable, without status migrainosus: Secondary | ICD-10-CM

## 2018-10-23 MED ORDER — RIZATRIPTAN BENZOATE 10 MG PO TBDP: 10.0000 mg | ORAL_TABLET | ORAL | 3 refills | Status: DC | PRN

## 2018-10-23 MED ORDER — TOPIRAMATE 50 MG PO TABS: 50.0000 mg | ORAL_TABLET | Freq: Two times a day (BID) | ORAL | 3 refills | Status: DC

## 2018-10-23 NOTE — Patient Instructions (Signed)
Continue Topamax 50 mg in the morning 100 mg at night If headaches worsen migraine tracker APP to record,  Avoid migraine triggers if possible Try to get physical activity every day, manage  stress follow-up yearly

## 2018-11-30 ENCOUNTER — Other Ambulatory Visit: Payer: Self-pay | Admitting: Neurosurgery

## 2018-11-30 DIAGNOSIS — Z6831 Body mass index (BMI) 31.0-31.9, adult: Secondary | ICD-10-CM | POA: Diagnosis not present

## 2018-11-30 DIAGNOSIS — M545 Low back pain: Secondary | ICD-10-CM | POA: Diagnosis not present

## 2018-11-30 DIAGNOSIS — M5416 Radiculopathy, lumbar region: Secondary | ICD-10-CM

## 2018-12-06 ENCOUNTER — Ambulatory Visit
Admission: RE | Admit: 2018-12-06 | Discharge: 2018-12-06 | Disposition: A | Discharge status: Home / Self Care | Payer: Federal, State, Local not specified - PPO | Source: Ambulatory Visit | Attending: Neurosurgery | Admitting: Neurosurgery

## 2018-12-06 DIAGNOSIS — M5416 Radiculopathy, lumbar region: Secondary | ICD-10-CM

## 2018-12-06 DIAGNOSIS — M48061 Spinal stenosis, lumbar region without neurogenic claudication: Secondary | ICD-10-CM | POA: Diagnosis not present

## 2018-12-17 NOTE — Progress Notes (Signed)
I reviewed note and agree with plan.   VIKRAM R. PENUMALLI, MD 

## 2019-01-12 DIAGNOSIS — D485 Neoplasm of uncertain behavior of skin: Secondary | ICD-10-CM | POA: Diagnosis not present

## 2019-01-12 DIAGNOSIS — L814 Other melanin hyperpigmentation: Secondary | ICD-10-CM | POA: Diagnosis not present

## 2019-01-12 DIAGNOSIS — M545 Low back pain: Secondary | ICD-10-CM | POA: Diagnosis not present

## 2019-01-12 DIAGNOSIS — Z6830 Body mass index (BMI) 30.0-30.9, adult: Secondary | ICD-10-CM | POA: Diagnosis not present

## 2019-01-12 DIAGNOSIS — D225 Melanocytic nevi of trunk: Secondary | ICD-10-CM | POA: Diagnosis not present

## 2019-01-12 DIAGNOSIS — D2239 Melanocytic nevi of other parts of face: Secondary | ICD-10-CM | POA: Diagnosis not present

## 2019-01-12 DIAGNOSIS — Z85820 Personal history of malignant melanoma of skin: Secondary | ICD-10-CM | POA: Diagnosis not present

## 2019-01-12 DIAGNOSIS — M5416 Radiculopathy, lumbar region: Secondary | ICD-10-CM | POA: Diagnosis not present

## 2019-01-27 DIAGNOSIS — C4401 Basal cell carcinoma of skin of lip: Secondary | ICD-10-CM | POA: Diagnosis not present

## 2019-02-24 DIAGNOSIS — M545 Low back pain: Secondary | ICD-10-CM | POA: Diagnosis not present

## 2019-02-24 DIAGNOSIS — M5416 Radiculopathy, lumbar region: Secondary | ICD-10-CM | POA: Diagnosis not present

## 2019-04-22 DIAGNOSIS — M5417 Radiculopathy, lumbosacral region: Secondary | ICD-10-CM | POA: Diagnosis not present

## 2019-04-22 DIAGNOSIS — M5127 Other intervertebral disc displacement, lumbosacral region: Secondary | ICD-10-CM | POA: Diagnosis not present

## 2019-05-05 DIAGNOSIS — L255 Unspecified contact dermatitis due to plants, except food: Secondary | ICD-10-CM | POA: Diagnosis not present

## 2019-05-08 DIAGNOSIS — L237 Allergic contact dermatitis due to plants, except food: Secondary | ICD-10-CM | POA: Diagnosis not present

## 2019-06-15 DIAGNOSIS — N6001 Solitary cyst of right breast: Secondary | ICD-10-CM | POA: Diagnosis not present

## 2019-06-16 DIAGNOSIS — M5127 Other intervertebral disc displacement, lumbosacral region: Secondary | ICD-10-CM | POA: Diagnosis not present

## 2019-06-16 DIAGNOSIS — M5417 Radiculopathy, lumbosacral region: Secondary | ICD-10-CM | POA: Diagnosis not present

## 2019-07-20 DIAGNOSIS — D485 Neoplasm of uncertain behavior of skin: Secondary | ICD-10-CM | POA: Diagnosis not present

## 2019-07-20 DIAGNOSIS — D225 Melanocytic nevi of trunk: Secondary | ICD-10-CM | POA: Diagnosis not present

## 2019-07-20 DIAGNOSIS — D2239 Melanocytic nevi of other parts of face: Secondary | ICD-10-CM | POA: Diagnosis not present

## 2019-07-20 DIAGNOSIS — L578 Other skin changes due to chronic exposure to nonionizing radiation: Secondary | ICD-10-CM | POA: Diagnosis not present

## 2019-07-20 DIAGNOSIS — L821 Other seborrheic keratosis: Secondary | ICD-10-CM | POA: Diagnosis not present

## 2019-08-11 DIAGNOSIS — N83201 Unspecified ovarian cyst, right side: Secondary | ICD-10-CM | POA: Diagnosis not present

## 2019-09-10 IMAGING — MR MR LUMBAR SPINE W/O CM
4 of 5 series · 18 of 48 positions shown · non-contrast
Comparison: 12/05/2015

CLINICAL DATA: Low back pain extending to the right leg with
numbness since Saturday January, 2010. Recent extension to the foot.

EXAM:
MRI LUMBAR SPINE WITHOUT CONTRAST
TECHNIQUE: Multiplanar, multisequence MR imaging of the lumbar spine was
performed. No intravenous contrast was administered.

[Series 6: T1 · sagittal · 4.0mm · 0.73mm/px · 3 of 16 slices shown (1 of 2)]
[im 4/16]
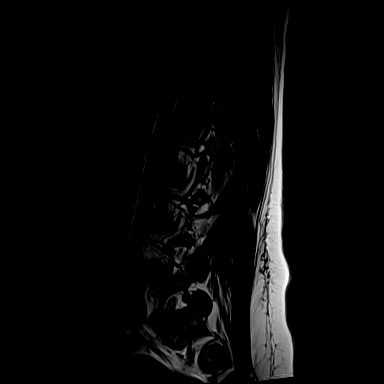
[im 10/16]
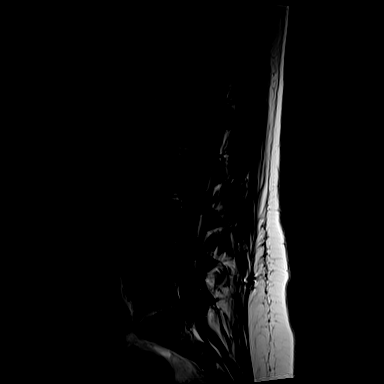
[im 16/16]
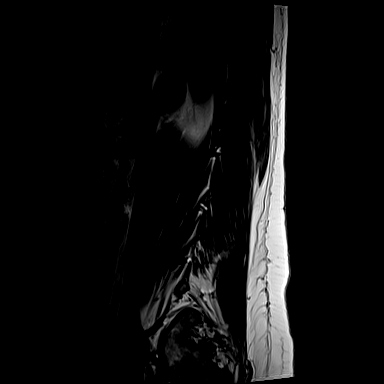

[Series 8: T2 · sagittal · 4.0mm · 0.73mm/px · 6 of 16 slices shown (1 of 2)]
[im 1/16]
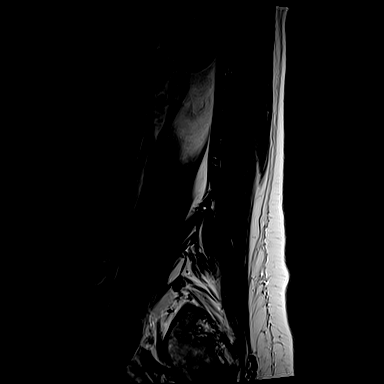
[im 4/16]
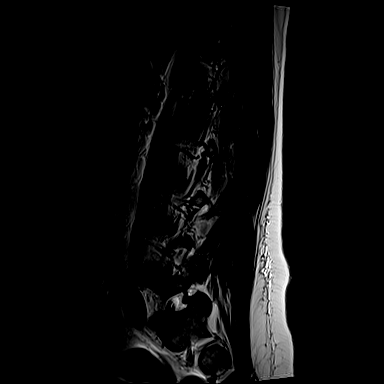
[im 7/16]
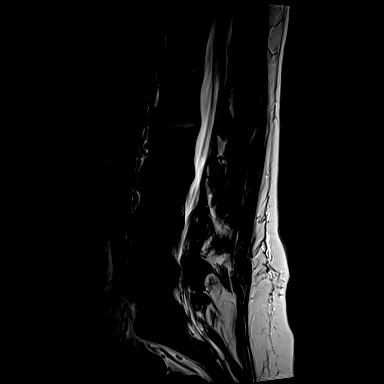
[im 10/16]
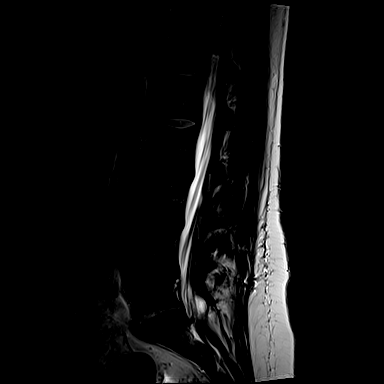
[im 13/16]
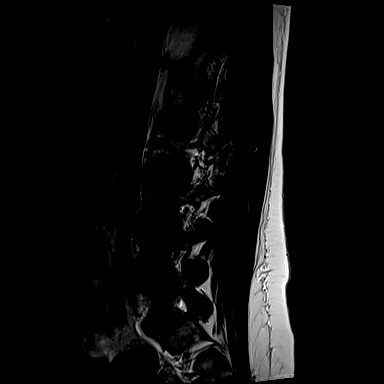
[im 16/16]
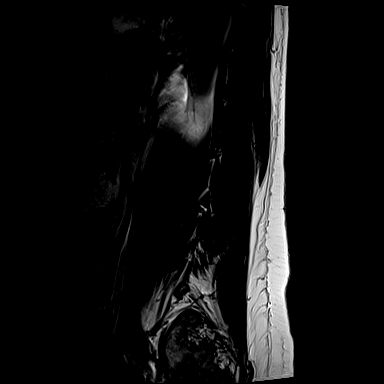

[Series 11: T1 · axial · 4.0mm · 0.28mm/px · z∈[-87,+76]mm · 3 of 43 slices shown (2 of 2)]
[im 7/43]
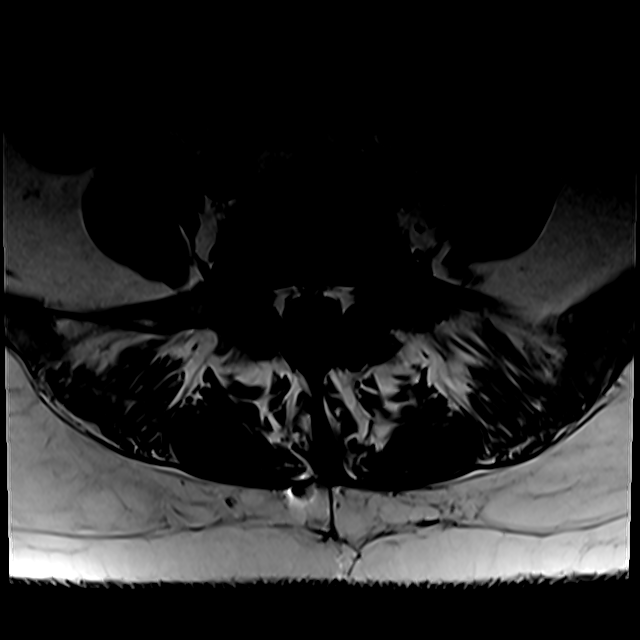
[im 22/43]
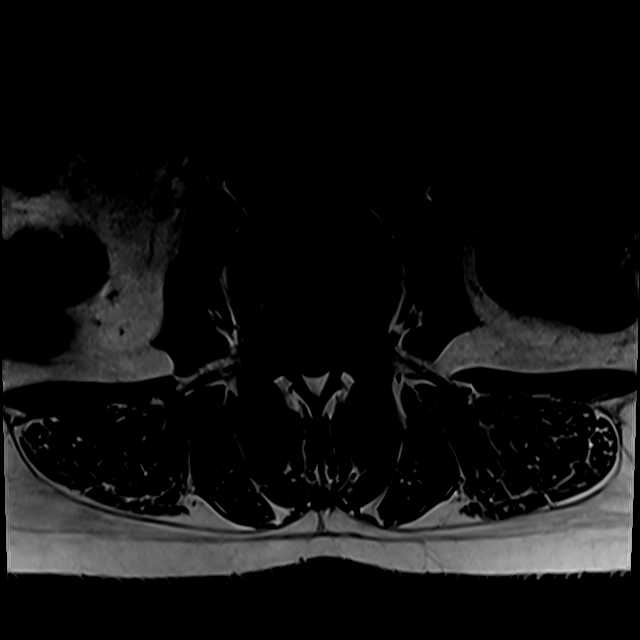
[im 37/43]
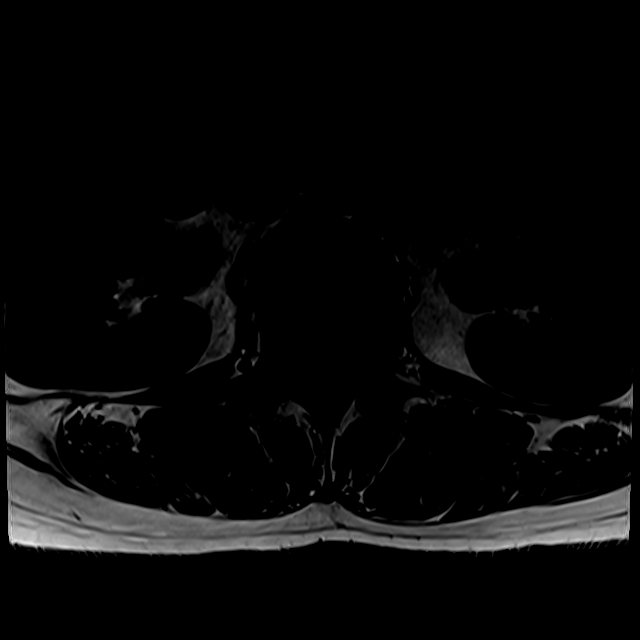

[Series 14: T2 · axial · 4.0mm · 0.28mm/px · z∈[-117,+76]mm · 6 of 43 slices shown (2 of 2)]
[im 1/43]
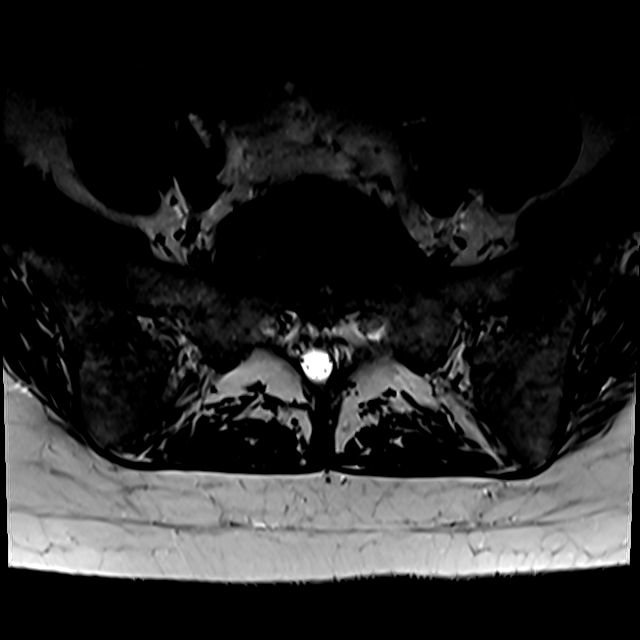
[im 7/43]
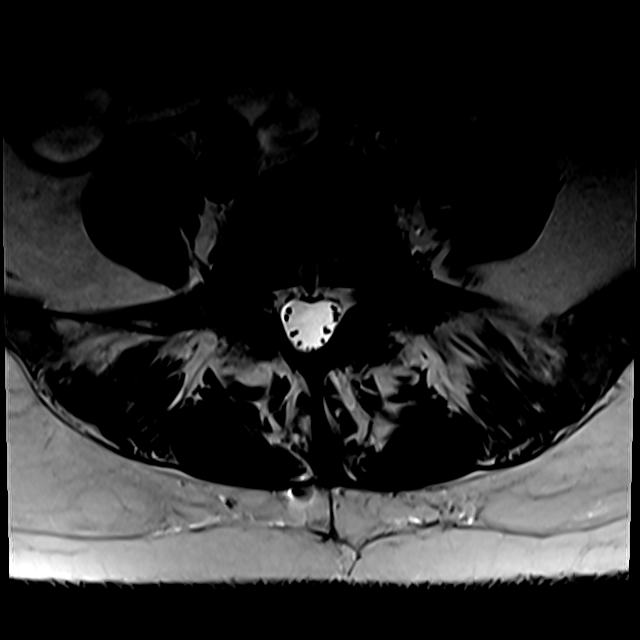
[im 13/43]
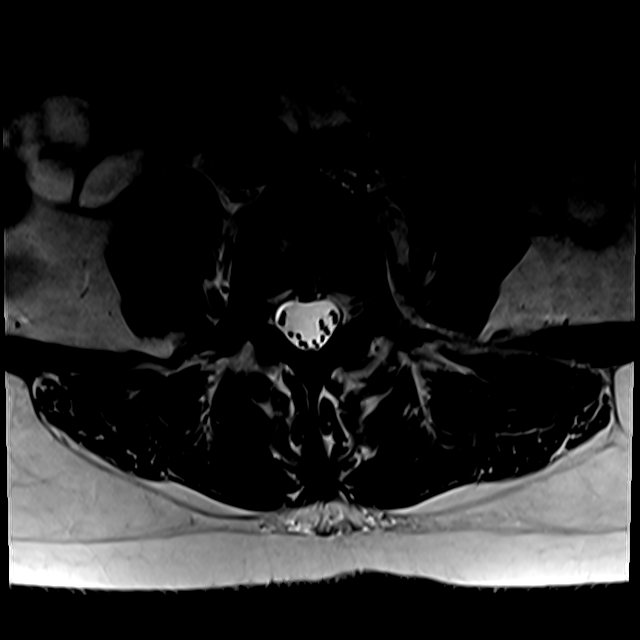
[im 19/43]
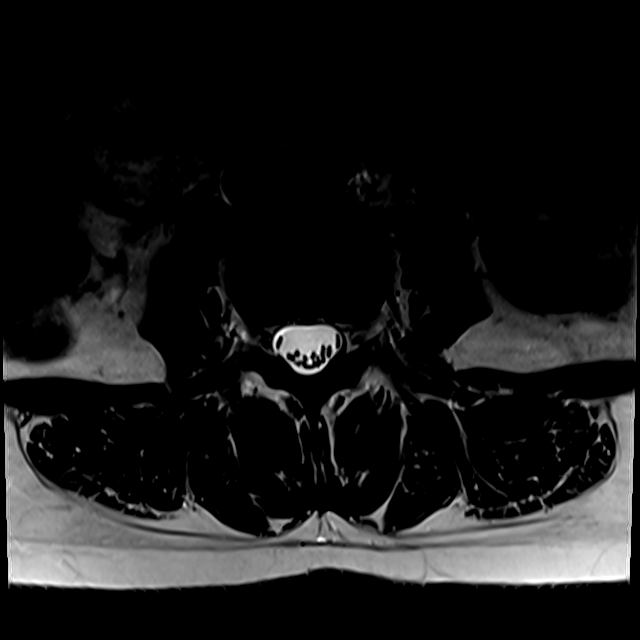
[im 22/43]
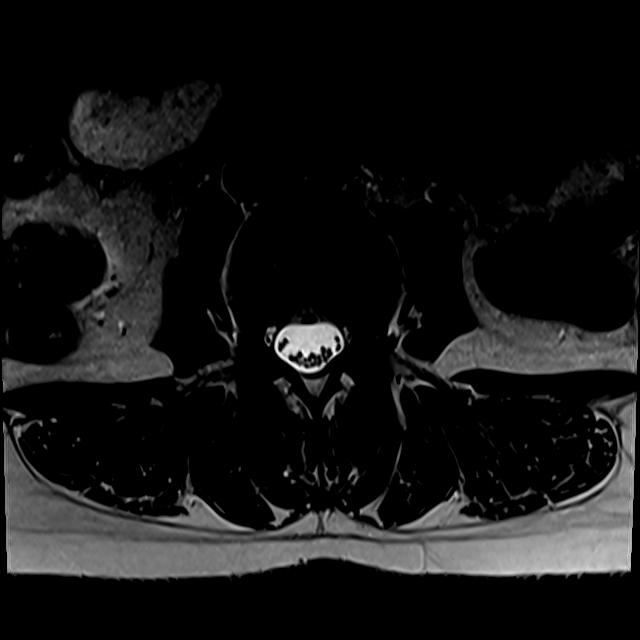
[im 37/43]
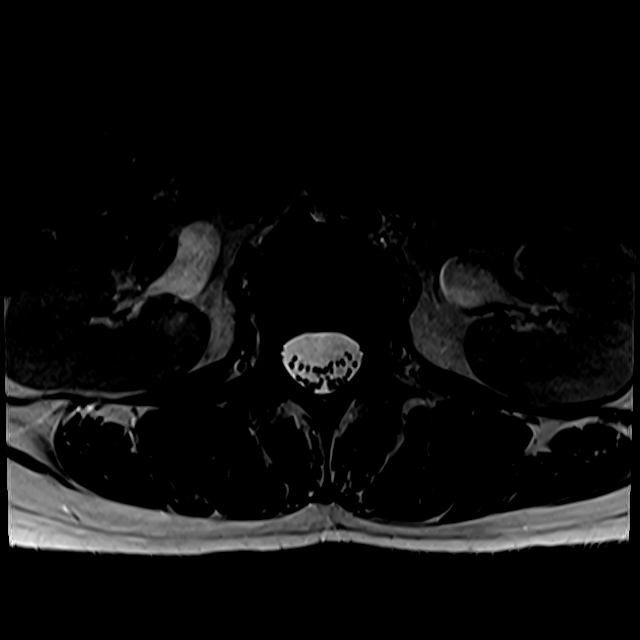

[18 of 48 positions shown; findings below may reference images not displayed]

FINDINGS: Segmentation:  5 lumbar type vertebral bodies.

Alignment:  Normal

Vertebrae:  No fracture or primary bone lesion.

Conus medullaris and cauda equina: Conus extends to the T12-L1
level. Conus and cauda equina appear normal.

Paraspinal and other soft tissues: Negative

Disc levels:

T12-L1 and L1-2: Normal.

L2-3: Disc degeneration with annular bulging. No compressive canal
or foraminal stenosis.

L3-4: Disc degeneration with annular bulging. No compressive canal
or foraminal stenosis. Minor facet effusions.

L4-5: Disc degeneration with annular bulging and shallow disc
protrusion. Facet and ligamentous hypertrophy. Narrowing of the
subarticular lateral recesses left more than right. Neural
compression could occur at this level in the lateral recesses. Minor
facet effusions.

L5-S1: Previous right hemilaminectomy and discectomy. Mild bulging
of the disc. No canal or foraminal stenosis.

Compared to the study 6708, there may be very slight worsening of
the lateral recess stenosis at L4-5.
IMPRESSION: Similar appearance to the study of 6708. The only suspected change
is slight worsening of lateral recess stenosis at L4-5 due to
annular bulging and shallow disc protrusion which, in combination
with facet and ligamentous hypertrophy causes lateral recess
narrowing of a degree that could possibly affect either or both L5
nerves.

Grossly non-compressive annular bulges at L2-3 and L3-4.

Chronic postoperative changes at L5-S1 without evidence of residual
or recurrent herniation or neural compressive stenosis.

## 2019-09-14 DIAGNOSIS — M5416 Radiculopathy, lumbar region: Secondary | ICD-10-CM | POA: Diagnosis not present

## 2019-09-14 DIAGNOSIS — M5127 Other intervertebral disc displacement, lumbosacral region: Secondary | ICD-10-CM | POA: Diagnosis not present

## 2019-09-14 DIAGNOSIS — R030 Elevated blood-pressure reading, without diagnosis of hypertension: Secondary | ICD-10-CM | POA: Diagnosis not present

## 2019-09-14 DIAGNOSIS — Z6829 Body mass index (BMI) 29.0-29.9, adult: Secondary | ICD-10-CM | POA: Diagnosis not present

## 2019-09-20 DIAGNOSIS — R209 Unspecified disturbances of skin sensation: Secondary | ICD-10-CM | POA: Diagnosis not present

## 2019-09-20 DIAGNOSIS — S345XXA Injury of lumbar, sacral and pelvic sympathetic nerves, initial encounter: Secondary | ICD-10-CM | POA: Diagnosis not present

## 2019-09-20 DIAGNOSIS — X58XXXA Exposure to other specified factors, initial encounter: Secondary | ICD-10-CM | POA: Diagnosis not present

## 2019-09-20 DIAGNOSIS — N801 Endometriosis of ovary: Secondary | ICD-10-CM | POA: Diagnosis not present

## 2019-10-21 DIAGNOSIS — R519 Headache, unspecified: Secondary | ICD-10-CM | POA: Diagnosis not present

## 2019-10-21 DIAGNOSIS — R6883 Chills (without fever): Secondary | ICD-10-CM | POA: Diagnosis not present

## 2019-10-21 DIAGNOSIS — R0981 Nasal congestion: Secondary | ICD-10-CM | POA: Diagnosis not present

## 2019-10-21 DIAGNOSIS — J029 Acute pharyngitis, unspecified: Secondary | ICD-10-CM | POA: Diagnosis not present

## 2019-10-21 DIAGNOSIS — R05 Cough: Secondary | ICD-10-CM | POA: Diagnosis not present

## 2019-10-25 DIAGNOSIS — M5416 Radiculopathy, lumbar region: Secondary | ICD-10-CM | POA: Diagnosis not present

## 2019-11-01 ENCOUNTER — Encounter: Payer: Self-pay | Admitting: Diagnostic Neuroimaging

## 2019-11-01 ENCOUNTER — Other Ambulatory Visit: Payer: Self-pay

## 2019-11-01 ENCOUNTER — Ambulatory Visit: Payer: Federal, State, Local not specified - PPO | Admitting: Diagnostic Neuroimaging

## 2019-11-01 VITALS — BP 123/80 | HR 103 | Temp 97.5°F | Ht 69.0 in | Wt 200.0 lb

## 2019-11-01 DIAGNOSIS — G43009 Migraine without aura, not intractable, without status migrainosus: Secondary | ICD-10-CM | POA: Diagnosis not present

## 2019-11-01 MED ORDER — TOPIRAMATE 100 MG PO TABS: 100.0000 mg | ORAL_TABLET | Freq: Two times a day (BID) | ORAL | 12 refills | Status: DC

## 2019-11-01 MED ORDER — RIZATRIPTAN BENZOATE 10 MG PO TBDP: 10.0000 mg | ORAL_TABLET | ORAL | 12 refills | Status: DC | PRN

## 2019-11-01 NOTE — Progress Notes (Signed)
[facility]  PATIENT: Ashley Lucero DOB: [birth year]  REFERRING CLINICIAN: Slatosky HISTORY FROM: patient  REASON FOR VISIT: follow up     HISTORICAL  CHIEF COMPLAINT:  Chief Complaint  Patient presents with  . Migraine    rm 7, one year FU "doing well, only getting slight headaches"    HISTORY OF PRESENT ILLNESS:   UPDATE (11/01/19, VRP): Since last visit, doing well except slight increase in headaches in the last few months.  Averaging 1-2 headaches per week.  Tolerating medications topiramate 50 mg in a.m. and 100 mg in p.m.  PRIOR HPI (11/14/17): 38 year old female here for evaluation of headaches and migraines.  Patient has history of lichen sclerosis, irritable bowel syndrome, chronic low back pain.  For past 2 years patient reports new onset of retro-orbital headaches, pain, with pressure sensation, progressing to her entire head.  Headaches can last hours at a time.  She has at least 2 headaches per week.  Headache severity ranges from 8-9 out of 10 in severity.  She has associated nausea, photophobia, but no sensitivity to smells or sounds.  No specific triggering factors.  She is tried Tylenol and ice packs, laying down in a dark room with mild relief.  She was started on topiramate at least 1 year ago with mild benefit.  No family history of migraine.   REVIEW OF SYSTEMS: Full 14 system review of systems performed and negative with exception of: as per HPI.   ALLERGIES: Allergies  Allergen Reactions  . Prednisone Other (See Comments)    Vision difficulty    HOME MEDICATIONS: Outpatient Medications Prior to Visit  Medication Sig Dispense Refill  . CARAFATE 1 GM/10ML suspension 500 mg 4 x daily    . Clobetasol Propionate (CLOBETASOL 17 PROPIONATE) 0.5 % POWD by Does not apply route.    . docusate sodium (COLACE) 50 MG capsule Take 50 mg by mouth daily.    . DULoxetine (CYMBALTA) 60 MG capsule Take 60 mg by mouth daily.    Marland Kitchen loratadine  (CLARITIN) 10 MG tablet Take 10 mg by mouth daily.    . meloxicam (MOBIC) 15 MG tablet Take 15 mg by mouth daily.    Marland Kitchen omeprazole (PRILOSEC) 20 MG capsule Take 20 mg by mouth daily.    . Pimecrolimus (ELIDEL EX) Apply 30 g topically.    . rizatriptan (MAXALT-MLT) 10 MG disintegrating tablet Take 1 tablet (10 mg total) by mouth as needed for migraine. May repeat in 2 hours if needed 27 tablet 3  . tiZANidine (ZANAFLEX) 4 MG tablet Take 4 mg by mouth every 8 (eight) hours as needed.    . topiramate (TOPAMAX) 50 MG tablet Take 1-2 tablets (50-100 mg total) by mouth 2 (two) times daily. 360 tablet 3  . cyclobenzaprine (FLEXERIL) 10 MG tablet Take 10 mg by mouth 2 (two) times daily as needed.  2  . DULoxetine (CYMBALTA) 30 MG capsule Take 30 mg by mouth 2 (two) times daily.  5   No facility-administered medications prior to visit.     PAST MEDICAL HISTORY: Past Medical History:  Diagnosis Date  . Chronic back pain   . GERD (gastroesophageal reflux disease)   . IBS (irritable bowel syndrome)   . Lichen sclerosus   . Migraine   . Neuropathy     PAST SURGICAL HISTORY: Past Surgical History:  Procedure Laterality Date  . LUMBAR DISC SURGERY  11/30/2014   L5-S1    FAMILY HISTORY: Family History  Problem Relation Age of Onset  . Diabetes Mother   . Hypertension Maternal Grandmother   . Thyroid disease Maternal Grandmother   . Diabetes Mellitus II Maternal Grandfather   . Lung cancer Maternal Grandfather   . CAD Paternal Grandmother   . Heart disease Paternal Grandmother   . Skin cancer Paternal Grandfather   . Leukemia Paternal Grandfather     SOCIAL HISTORY:  Social History   Socioeconomic History  . Marital status: Married    Spouse name: Lysle Pearllicha  . Number of children: 0  . Years of education: 16 BS  . Highest education level: Not on file  Occupational History    Comment: [employer]  Social Needs  . Financial resource strain: Not on file  . Food insecurity    Worry: Not on  file    Inability: Not on file  . Transportation needs    Medical: Not on file    Non-medical: Not on file  Tobacco Use  . Smoking status: Former Smoker    Quit date: 11/14/2012    Years since quitting: 6.9  . Smokeless tobacco: Never Used  Substance and Sexual Activity  . Alcohol use: Yes    Comment: 2-3 weekly  . Drug use: No  . Sexual activity: Not on file  Lifestyle  . Physical activity    Days per week: Not on file    Minutes per session: Not on file  . Stress: Not on file  Relationships  . Social Musicianconnections    Talks on phone: Not on file    Gets together: Not on file    Attends religious service: Not on file    Active member of club or organization: Not on file    Attends meetings of clubs or organizations: Not on file    Relationship status: Not on file  . Intimate partner violence    Fear of current or ex partner: Not on file    Emotionally abused: Not on file    Physically abused: Not on file    Forced sexual activity: Not on file  Other Topics Concern  . Not on file  Social History Narrative   Married   caffeine coffee, 3 cups daily     PHYSICAL EXAM  GENERAL EXAM/CONSTITUTIONAL: Vitals:  Vitals:   11/01/19 1613  BP: 123/80  Pulse: (!) 103  Temp: (!) 97.5 F (36.4 C)  Weight: 200 lb (90.7 kg)  Height: 5\' 9"  (1.753 m)   Body mass index is 29.53 kg/m. No exam data present  Patient is in no distress; well developed, nourished and groomed; neck is supple  CARDIOVASCULAR:  Examination of carotid arteries is normal; no carotid bruits  Regular rate and rhythm, no murmurs  Examination of peripheral vascular system by observation and palpation is normal  EYES:  Ophthalmoscopic exam of optic discs and posterior segments is normal; no papilledema or hemorrhages  MUSCULOSKELETAL:  Gait, strength, tone, movements noted in Neurologic exam below  NEUROLOGIC: MENTAL STATUS:  No flowsheet data found.  awake, alert, oriented to person, place  and time  recent and remote memory intact  normal attention and concentration  language fluent, comprehension intact, naming intact,   fund of knowledge appropriate  CRANIAL NERVE:   2nd - no papilledema on fundoscopic exam  2nd, 3rd, 4th, 6th - pupils equal and reactive to light, visual fields full to confrontation, extraocular muscles intact, no nystagmus  5th - facial sensation symmetric  7th - facial strength symmetric  8th - hearing  intact  9th - palate elevates symmetrically, uvula midline  11th - shoulder shrug symmetric  12th - tongue protrusion midline  MOTOR:   normal bulk and tone, full strength in the BUE, LLE  RLE 4  SENSORY:   normal and symmetric to light touch, temperature, vibration  COORDINATION:   finger-nose-finger, fine finger movements normal  REFLEXES:   deep tendon reflexes TRACE   GAIT/STATION:   narrow based gait    DIAGNOSTIC DATA (LABS, IMAGING, TESTING) - I reviewed patient records, labs, notes, testing and imaging myself where available.  No results found for: WBC, HGB, HCT, MCV, PLT No results found for: NA, K, CL, CO2, GLUCOSE, BUN, CREATININE, CALCIUM, PROT, ALBUMIN, AST, ALT, ALKPHOS, BILITOT, GFRNONAA, GFRAA No results found for: CHOL, HDL, LDLCALC, LDLDIRECT, TRIG, CHOLHDL No results found for: HGBA1C No results found for: VITAMINB12 No results found for: TSH   08/20/06 MRI brain [I reviewed images myself and agree with interpretation. -VRP]  - normal  07/25/16 MRI brain [I reviewed images myself and agree with interpretation. -VRP]  - normal    ASSESSMENT AND PLAN  38 y.o. year old female here with history of headaches since 2016 with migraine features.   Dx:  1. Migraine without aura and without status migrainosus, not intractable     PLAN:  - increase topiramate to 100mg  twice a day; drink plenty of water   - continue rizatriptan 10mg  as needed for breakthrough headache; may repeat x 1 after 2  hours; max 2 tabs per day or 8 per month  Meds ordered this encounter  Medications  . topiramate (TOPAMAX) 100 MG tablet    Sig: Take 1 tablet (100 mg total) by mouth 2 (two) times daily.    Dispense:  60 tablet    Refill:  12  . rizatriptan (MAXALT-MLT) 10 MG disintegrating tablet    Sig: Take 1 tablet (10 mg total) by mouth as needed for migraine. May repeat in 2 hours if needed    Dispense:  9 tablet    Refill:  12   Return in about 1 year (around 10/31/2020) for with NP (Amy Lomax).    Penni Bombard, MD 23/53/6144, 3:15 PM Certified in Neurology, Neurophysiology and Neuroimaging  [facility] [address] [place], [place] [address] [phone]

## 2019-11-01 NOTE — Patient Instructions (Signed)
-   increase topiramate to 100mg  twice a day; drink plenty of water   - continue rizatriptan 10mg  as needed for breakthrough headache; may repeat x 1 after 2 hours; max 2 tabs per day or 8 per month

## 2019-11-22 DIAGNOSIS — R030 Elevated blood-pressure reading, without diagnosis of hypertension: Secondary | ICD-10-CM | POA: Diagnosis not present

## 2019-11-22 DIAGNOSIS — Z6829 Body mass index (BMI) 29.0-29.9, adult: Secondary | ICD-10-CM | POA: Diagnosis not present

## 2020-10-31 ENCOUNTER — Encounter: Payer: Self-pay | Admitting: Family Medicine

## 2020-10-31 ENCOUNTER — Ambulatory Visit (INDEPENDENT_AMBULATORY_CARE_PROVIDER_SITE_OTHER): Payer: Federal, State, Local not specified - PPO | Admitting: Family Medicine

## 2020-10-31 VITALS — BP 120/79 | HR 78 | Ht 69.0 in | Wt 186.0 lb

## 2020-10-31 DIAGNOSIS — G43009 Migraine without aura, not intractable, without status migrainosus: Secondary | ICD-10-CM

## 2020-10-31 MED ORDER — TOPIRAMATE 100 MG PO TABS: 100.0000 mg | ORAL_TABLET | Freq: Two times a day (BID) | ORAL | 3 refills | Status: DC

## 2020-10-31 MED ORDER — RIZATRIPTAN BENZOATE 10 MG PO TBDP: 10.0000 mg | ORAL_TABLET | ORAL | 12 refills | Status: DC | PRN

## 2020-10-31 NOTE — Progress Notes (Signed)
Chief Complaint  Patient presents with  . Follow-up    [facility]  . Migraine    pt said she get 2-3 migraines a month     HISTORY OF PRESENT ILLNESS: Today 10/31/20  Ashley Lucero is a 39 y.o. female here today for follow up for migraines. She continues topiramate 100mg  BID and rizatriptan as needed. She has about 2-3 migraines per month, easily aborted. She is now on norethindrone 5mg  and goserelin injections s/p hysterectomy with right oophorectomy in 08/2020. She was having significant pain and found to have endometriosis and ovarian cyst. She feels that she is doing fairly well and headaches are well managed.    HISTORY (copied from previous note)  UPDATE (11/01/19, VRP): Since last visit, doing well except slight increase in headaches in the last few months.  Averaging 1-2 headaches per week.  Tolerating medications topiramate 50 mg in a.m. and 100 mg in p.m.  PRIOR HPI (11/14/17): 39 year old female here for evaluation of headaches and migraines.  Patient has history of lichen sclerosis, irritable bowel syndrome, chronic low back pain.  For past 2 years patient reports new onset of retro-orbital headaches, pain, with pressure sensation, progressing to her entire head.  Headaches can last hours at a time.  She has at least 2 headaches per week.  Headache severity ranges from 8-9 out of 10 in severity.  She has associated nausea, photophobia, but no sensitivity to smells or sounds.  No specific triggering factors.  She is tried Tylenol and ice packs, laying down in a dark room with mild relief.  She was started on topiramate at least 1 year ago with mild benefit.  No family history of migraine.   REVIEW OF SYSTEMS: Out of a complete 14 system review of symptoms, the patient complains only of the following symptoms, headaches and all other reviewed systems are negative.   ALLERGIES: Allergies  Allergen Reactions  . Prednisone Other (See Comments)    Vision difficulty      HOME MEDICATIONS: Outpatient Medications Prior to Visit  Medication Sig Dispense Refill  . CARAFATE 1 GM/10ML suspension 500 mg 4 x daily    . Clobetasol Propionate (CLOBETASOL 17 PROPIONATE) 0.5 % POWD by Does not apply route.    . docusate sodium (COLACE) 50 MG capsule Take 50 mg by mouth daily.    . DULoxetine (CYMBALTA) 60 MG capsule Take 60 mg by mouth daily.    11/16/17 goserelin (ZOLADEX) 10.8 MG injection Inject 10.8 mg into the skin once.    . loratadine (CLARITIN) 10 MG tablet Take 10 mg by mouth daily.    . norethindrone (AYGESTIN) 5 MG tablet Take by mouth daily.    31 omeprazole (PRILOSEC) 20 MG capsule Take 20 mg by mouth daily.    . Pimecrolimus (ELIDEL EX) Apply 30 g topically.    Marland Kitchen tiZANidine (ZANAFLEX) 4 MG tablet Take 4 mg by mouth every 8 (eight) hours as needed.    . rizatriptan (MAXALT-MLT) 10 MG disintegrating tablet Take 1 tablet (10 mg total) by mouth as needed for migraine. May repeat in 2 hours if needed 9 tablet 12  . topiramate (TOPAMAX) 100 MG tablet Take 1 tablet (100 mg total) by mouth 2 (two) times daily. 60 tablet 12  . meloxicam (MOBIC) 15 MG tablet Take 15 mg by mouth daily.     No facility-administered medications prior to visit.     PAST MEDICAL HISTORY: Past Medical History:  Diagnosis Date  .  Chronic back pain   . GERD (gastroesophageal reflux disease)   . IBS (irritable bowel syndrome)   . Lichen sclerosus   . Migraine   . Neuropathy      PAST SURGICAL HISTORY: Past Surgical History:  Procedure Laterality Date  . LUMBAR DISC SURGERY  11/30/2014   L5-S1     FAMILY HISTORY: Family History  Problem Relation Age of Onset  . Diabetes Mother   . Hypertension Maternal Grandmother   . Thyroid disease Maternal Grandmother   . Diabetes Mellitus II Maternal Grandfather   . Lung cancer Maternal Grandfather   . CAD Paternal Grandmother   . Heart disease Paternal Grandmother   . Skin cancer Paternal Grandfather   . Leukemia Paternal  Grandfather      SOCIAL HISTORY: Social History   Socioeconomic History  . Marital status: Married    Spouse name: Lysle Pearl  . Number of children: 0  . Years of education: 16 BS  . Highest education level: Not on file  Occupational History    Comment: [employer]  Tobacco Use  . Smoking status: Former Smoker    Quit date: 11/14/2012    Years since quitting: 7.9  . Smokeless tobacco: Never Used  Substance and Sexual Activity  . Alcohol use: Yes    Comment: 2-3 weekly  . Drug use: No  . Sexual activity: Not on file  Other Topics Concern  . Not on file  Social History Narrative   Married   caffeine coffee, 3 cups daily   Social Determinants of Health   Financial Resource Strain:   . Difficulty of Paying Living Expenses: Not on file  Food Insecurity:   . Worried About Programme researcher, broadcasting/film/video in the Last Year: Not on file  . Ran Out of Food in the Last Year: Not on file  Transportation Needs:   . Lack of Transportation (Medical): Not on file  . Lack of Transportation (Non-Medical): Not on file  Physical Activity:   . Days of Exercise per Week: Not on file  . Minutes of Exercise per Session: Not on file  Stress:   . Feeling of Stress : Not on file  Social Connections:   . Frequency of Communication with Friends and Family: Not on file  . Frequency of Social Gatherings with Friends and Family: Not on file  . Attends Religious Services: Not on file  . Active Member of Clubs or Organizations: Not on file  . Attends Banker Meetings: Not on file  . Marital Status: Not on file  Intimate Partner Violence:   . Fear of Current or Ex-Partner: Not on file  . Emotionally Abused: Not on file  . Physically Abused: Not on file  . Sexually Abused: Not on file      PHYSICAL EXAM  Vitals:   10/31/20 1515  BP: 120/79  Pulse: 78  Weight: 186 lb (84.4 kg)  Height: 5\' 9"  (1.753 m)   Body mass index is 27.47 kg/m.   Generalized: Well developed, in no acute  distress  Cardiology: normal rate and rhythm, no murmur auscultated  Respiratory: clear to auscultation bilaterally    Neurological examination  Mentation: Alert oriented to time, place, history taking. Follows all commands speech and language fluent Cranial nerve II-XII: Pupils were equal round reactive to light. Extraocular movements were full, visual field were full on confrontational test. Facial sensation and strength were normal. Head turning and shoulder shrug  were normal and symmetric. Motor: The motor testing reveals  5 over 5 strength of all 4 extremities. Good symmetric motor tone is noted throughout.  Gait and station: Gait is normal.  Reflexes: Deep tendon reflexes are symmetric and normal bilaterally.     DIAGNOSTIC DATA (LABS, IMAGING, TESTING) - I reviewed patient records, labs, notes, testing and imaging myself where available.  No results found for: WBC, HGB, HCT, MCV, PLT No results found for: NA, K, CL, CO2, GLUCOSE, BUN, CREATININE, CALCIUM, PROT, ALBUMIN, AST, ALT, ALKPHOS, BILITOT, GFRNONAA, GFRAA No results found for: CHOL, HDL, LDLCALC, LDLDIRECT, TRIG, CHOLHDL No results found for: DZHG9J No results found for: VITAMINB12 No results found for: TSH    ASSESSMENT AND PLAN  39 y.o. year old female  has a past medical history of Chronic back pain, GERD (gastroesophageal reflux disease), IBS (irritable bowel syndrome), Lichen sclerosus, Migraine, and Neuropathy. here with   Migraine without aura and without status migrainosus, not intractable   Margit continues to do very well on topiramate 100 mg twice daily and rizatriptan as needed.  We will continue current treatment plan.  She will monitor symptoms very closely and let me know if headaches worsen.  She will continue close follow-up with primary care and gynecology.  She will follow-up with me in 1 year, sooner if needed.  She verbalizes understanding and agreement with this plan.  I spent 20 minutes of  face-to-face and non-face-to-face time with patient.  This included previsit chart review, lab review, study review, order entry, electronic health record documentation, patient education.    Shawnie Dapper, MSN, FNP-C 10/31/2020, 3:53 PM  [facility] [address], [address] [place], [place] [address] [phone]

## 2020-10-31 NOTE — Patient Instructions (Addendum)
Below is our plan:  We will continue topiramate 100mg  twice daily and rizatriptan as needed   Please make sure you are staying well hydrated. I recommend 50-60 ounces daily. Well balanced diet and regular exercise encouraged.   Please continue follow up with care team as directed.   Follow up in 1 year   You may receive a survey regarding today's visit. I encourage you to leave honest feed back as I do use this information to improve patient care. Thank you for seeing me today!      Migraine Headache A migraine headache is a very strong throbbing pain on one side or both sides of your head. This type of headache can also cause other symptoms. It can last from 4 hours to 3 days. Talk with your doctor about what things may bring on (trigger) this condition. What are the causes? The exact cause of this condition is not known. This condition may be triggered or caused by:  Drinking alcohol.  Smoking.  Taking medicines, such as: ? Medicine used to treat chest pain (nitroglycerin). ? Birth control pills. ? Estrogen. ? Some blood pressure medicines.  Eating or drinking certain products.  Doing physical activity. Other things that may trigger a migraine headache include:  Having a menstrual period.  Pregnancy.  Hunger.  Stress.  Not getting enough sleep or getting too much sleep.  Weather changes.  Tiredness (fatigue). What increases the risk?  Being 80-75 years old.  Being female.  Having a family history of migraine headaches.  Being Caucasian.  Having depression or anxiety.  Being very overweight. What are the signs or symptoms?  A throbbing pain. This pain may: ? Happen in any area of the head, such as on one side or both sides. ? Make it hard to do daily activities. ? Get worse with physical activity. ? Get worse around bright lights or loud noises.  Other symptoms may include: ? Feeling sick to your stomach  (nauseous). ? Vomiting. ? Dizziness. ? Being sensitive to bright lights, loud noises, or smells.  Before you get a migraine headache, you may get warning signs (an aura). An aura may include: ? Seeing flashing lights or having blind spots. ? Seeing bright spots, halos, or zigzag lines. ? Having tunnel vision or blurred vision. ? Having numbness or a tingling feeling. ? Having trouble talking. ? Having weak muscles.  Some people have symptoms after a migraine headache (postdromal phase), such as: ? Tiredness. ? Trouble thinking (concentrating). How is this treated?  Taking medicines that: ? Relieve pain. ? Relieve the feeling of being sick to your stomach. ? Prevent migraine headaches.  Treatment may also include: ? Having acupuncture. ? Avoiding foods that bring on migraine headaches. ? Learning ways to control your body functions (biofeedback). ? Therapy to help you know and deal with negative thoughts (cognitive behavioral therapy). Follow these instructions at home: Medicines  Take over-the-counter and prescription medicines only as told by your doctor.  Ask your doctor if the medicine prescribed to you: ? Requires you to avoid driving or using heavy machinery. ? Can cause trouble pooping (constipation). You may need to take these steps to prevent or treat trouble pooping:  Drink enough fluid to keep your pee (urine) pale yellow.  Take over-the-counter or prescription medicines.  Eat foods that are high in fiber. These include beans, whole grains, and fresh fruits and vegetables.  Limit foods that are high in fat and sugar. These include fried or sweet foods.  Lifestyle  Do not drink alcohol.  Do not use any products that contain nicotine or tobacco, such as cigarettes, e-cigarettes, and chewing tobacco. If you need help quitting, ask your doctor.  Get at least 8 hours of sleep every night.  Limit and deal with stress. General instructions      Keep a  journal to find out what may bring on your migraine headaches. For example, write down: ? What you eat and drink. ? How much sleep you get. ? Any change in what you eat or drink. ? Any change in your medicines.  If you have a migraine headache: ? Avoid things that make your symptoms worse, such as bright lights. ? It may help to lie down in a dark, quiet room. ? Do not drive or use heavy machinery. ? Ask your doctor what activities are safe for you.  Keep all follow-up visits as told by your doctor. This is important. Contact a doctor if:  You get a migraine headache that is different or worse than others you have had.  You have more than 15 headache days in one month. Get help right away if:  Your migraine headache gets very bad.  Your migraine headache lasts longer than 72 hours.  You have a fever.  You have a stiff neck.  You have trouble seeing.  Your muscles feel weak or like you cannot control them.  You start to lose your balance a lot.  You start to have trouble walking.  You pass out (faint).  You have a seizure. Summary  A migraine headache is a very strong throbbing pain on one side or both sides of your head. These headaches can also cause other symptoms.  This condition may be treated with medicines and changes to your lifestyle.  Keep a journal to find out what may bring on your migraine headaches.  Contact a doctor if you get a migraine headache that is different or worse than others you have had.  Contact your doctor if you have more than 15 headache days in a month. This information is not intended to replace advice given to you by your health care provider. Make sure you discuss any questions you have with your health care provider. Document Revised: 03/12/2019 Document Reviewed: 12/31/2018 Elsevier Patient Education  [place].

## 2021-10-31 ENCOUNTER — Encounter: Payer: Self-pay | Admitting: Family Medicine

## 2021-10-31 ENCOUNTER — Ambulatory Visit: Payer: Federal, State, Local not specified - PPO | Admitting: Family Medicine

## 2021-10-31 VITALS — BP 125/86 | HR 102 | Ht 69.0 in | Wt 205.5 lb

## 2021-10-31 DIAGNOSIS — G43009 Migraine without aura, not intractable, without status migrainosus: Secondary | ICD-10-CM | POA: Diagnosis not present

## 2021-10-31 MED ORDER — TOPIRAMATE 100 MG PO TABS: 100.0000 mg | ORAL_TABLET | Freq: Two times a day (BID) | ORAL | 3 refills | Status: DC

## 2021-10-31 MED ORDER — RIZATRIPTAN BENZOATE 10 MG PO TBDP: 10.0000 mg | ORAL_TABLET | ORAL | 12 refills | Status: DC | PRN

## 2021-10-31 NOTE — Patient Instructions (Signed)
Below is our plan:  We will continue topiramate 100mg  twice daily and rizatriptan as needed.   Please make sure you are staying well hydrated. I recommend 50-60 ounces daily. Well balanced diet and regular exercise encouraged. Consistent sleep schedule with 6-8 hours recommended.   Please continue follow up with care team as directed.   Follow up with me in 1 year   You may receive a survey regarding today's visit. I encourage you to leave honest feed back as I do use this information to improve patient care. Thank you for seeing me today!

## 2021-10-31 NOTE — Progress Notes (Signed)
Chief Complaint  Patient presents with   Follow-up    Rm 10, alone. Here for yearly migraine f/u. Pt reports migraines are once a week. When tinnitus is severe it will cause a migraine.     HISTORY OF PRESENT ILLNESS: 10/31/21 ALL: Ashley Lucero returns for follow up for migraines. She continues topiramate 100mg  BID and rizatriptan as needed. She is doing well. She has about 3-5 migraines per month. Rizatriptan works well for abortive therapy. Rest and dark room also helps.   10/31/2020 ALL: Ashley Lucero is a 40 y.o. female here today for follow up for migraines. She continues topiramate 100mg  BID and rizatriptan as needed. She has about 2-3 migraines per month, easily aborted. She is now on norethindrone 5mg  and goserelin injections s/p hysterectomy with right oophorectomy in 08/2020. She was having significant pain and found to have endometriosis and ovarian cyst. She feels that she is doing fairly well and headaches are well managed.    HISTORY (copied from previous note)  UPDATE (11/01/19, VRP): Since last visit, doing well except slight increase in headaches in the last few months.  Averaging 1-2 headaches per week.  Tolerating medications topiramate 50 mg in a.m. and 100 mg in p.m.   PRIOR HPI (11/14/17): 40 year old female here for evaluation of headaches and migraines.  Patient has history of lichen sclerosis, irritable bowel syndrome, chronic low back pain.   For past 2 years patient reports new onset of retro-orbital headaches, pain, with pressure sensation, progressing to her entire head.  Headaches can last hours at a time.  She has at least 2 headaches per week.  Headache severity ranges from 8-9 out of 10 in severity.  She has associated nausea, photophobia, but no sensitivity to smells or sounds.  No specific triggering factors.  She is tried Tylenol and ice packs, laying down in a dark room with mild relief.  She was started on topiramate at least 1 year ago with mild benefit.   No family history of migraine.    REVIEW OF SYSTEMS: Out of a complete 14 system review of symptoms, the patient complains only of the following symptoms, headaches and all other reviewed systems are negative.   ALLERGIES: Allergies  Allergen Reactions   Prednisone Other (See Comments)    Vision difficulty     HOME MEDICATIONS: Outpatient Medications Prior to Visit  Medication Sig Dispense Refill   Clobetasol Propionate (CLOBETASOL 17 PROPIONATE) 0.5 % POWD by Does not apply route.     docusate sodium (COLACE) 50 MG capsule Take 50 mg by mouth daily.     DULoxetine (CYMBALTA) 60 MG capsule Take 60 mg by mouth daily.     loratadine (CLARITIN) 10 MG tablet Take 10 mg by mouth daily.     norethindrone (AYGESTIN) 5 MG tablet Take by mouth daily.     nortriptyline (PAMELOR) 10 MG capsule Take 10 mg by mouth at bedtime.     omeprazole (PRILOSEC) 20 MG capsule Take 20 mg by mouth daily.     Pimecrolimus (ELIDEL EX) Apply 30 g topically.     PREMARIN 0.625 MG tablet Take 0.625 mg by mouth daily.     rizatriptan (MAXALT-MLT) 10 MG disintegrating tablet Take 1 tablet (10 mg total) by mouth as needed for migraine. May repeat in 2 hours if needed 9 tablet 12   tiZANidine (ZANAFLEX) 4 MG tablet Take 4 mg by mouth every 8 (eight) hours as needed.     topiramate (TOPAMAX) 100 MG  tablet Take 1 tablet (100 mg total) by mouth 2 (two) times daily. 180 tablet 3   CARAFATE 1 GM/10ML suspension 500 mg 4 x daily     goserelin (ZOLADEX) 10.8 MG injection Inject 10.8 mg into the skin once.     No facility-administered medications prior to visit.     PAST MEDICAL HISTORY: Past Medical History:  Diagnosis Date   Chronic back pain    GERD (gastroesophageal reflux disease)    IBS (irritable bowel syndrome)    Lichen sclerosus    Migraine    Neuropathy      PAST SURGICAL HISTORY: Past Surgical History:  Procedure Laterality Date   LUMBAR DISC SURGERY  11/30/2014   L5-S1     FAMILY  HISTORY: Family History  Problem Relation Age of Onset   Diabetes Mother    Hypertension Maternal Grandmother    Thyroid disease Maternal Grandmother    Diabetes Mellitus II Maternal Grandfather    Lung cancer Maternal Grandfather    CAD Paternal Grandmother    Heart disease Paternal Grandmother    Skin cancer Paternal Grandfather    Leukemia Paternal Grandfather      SOCIAL HISTORY: Social History   Socioeconomic History   Marital status: Married    Spouse name: Hydrographic surveyor   Number of children: 0   Years of education: 16 BS   Highest education level: Not on file  Occupational History    Comment: [employer]  Tobacco Use   Smoking status: Former    Types: Cigarettes    Quit date: 11/14/2012    Years since quitting: 8.9   Smokeless tobacco: Never  Substance and Sexual Activity   Alcohol use: Yes    Comment: 2-3 weekly   Drug use: No   Sexual activity: Not on file  Other Topics Concern   Not on file  Social History Narrative   Married   caffeine coffee, 3 cups daily   Social Determinants of Health   Financial Resource Strain: Not on file  Food Insecurity: Not on file  Transportation Needs: Not on file  Physical Activity: Not on file  Stress: Not on file  Social Connections: Not on file  Intimate Partner Violence: Not on file      PHYSICAL EXAM  Vitals:   10/31/21 1508  BP: 125/86  Pulse: (!) 102  Weight: 205 lb 8 oz (93.2 kg)  Height: 5\' 9"  (1.753 m)    Body mass index is 30.35 kg/m.   Generalized: Well developed, in no acute distress  Cardiology: normal rate and rhythm, no murmur auscultated  Respiratory: clear to auscultation bilaterally    Neurological examination  Mentation: Alert oriented to time, place, history taking. Follows all commands speech and language fluent Cranial nerve II-XII: Pupils were equal round reactive to light. Extraocular movements were full, visual field were full on confrontational test. Facial sensation and strength were  normal. Head turning and shoulder shrug  were normal and symmetric. Motor: The motor testing reveals 5 over 5 strength of all 4 extremities. Good symmetric motor tone is noted throughout.  Gait and station: Gait is normal.     DIAGNOSTIC DATA (LABS, IMAGING, TESTING) - I reviewed patient records, labs, notes, testing and imaging myself where available.  No results found for: WBC, HGB, HCT, MCV, PLT No results found for: NA, K, CL, CO2, GLUCOSE, BUN, CREATININE, CALCIUM, PROT, ALBUMIN, AST, ALT, ALKPHOS, BILITOT, GFRNONAA, GFRAA No results found for: CHOL, HDL, LDLCALC, LDLDIRECT, TRIG, CHOLHDL No results found for:  HGBA1C No results found for: VITAMINB12 No results found for: TSH    ASSESSMENT AND PLAN  40 y.o. year old female  has a past medical history of Chronic back pain, GERD (gastroesophageal reflux disease), IBS (irritable bowel syndrome), Lichen sclerosus, Migraine, and Neuropathy. here with   Migraine without aura and without status migrainosus, not intractable   Huong continues to do very well on topiramate 100 mg twice daily and rizatriptan as needed.  We will continue current treatment plan.  She will monitor symptoms very closely and let me know if headaches worsen.  She will continue close follow-up with primary care and gynecology.  She will follow-up with me in 1 year, sooner if needed.  She verbalizes understanding and agreement with this plan.   Shawnie Dapper, MSN, FNP-C 10/31/2021, 3:17 PM  [facility] [address] [place], [place] [address] [phone]

## 2022-10-30 ENCOUNTER — Other Ambulatory Visit: Payer: Self-pay | Admitting: *Deleted

## 2022-10-30 MED ORDER — TOPIRAMATE 100 MG PO TABS: 100.0000 mg | ORAL_TABLET | Freq: Two times a day (BID) | ORAL | 3 refills | Status: DC

## 2022-10-30 NOTE — Progress Notes (Unsigned)
No chief complaint on file.   HISTORY OF PRESENT ILLNESS:  10/30/22 ALL: Ashley Lucero returns for follow up for migraines. She continues topiramate 100mg  BID and rizatriptan as needed.   10/31/2021 ALL: Ashley Lucero returns for follow up for migraines. She continues topiramate 100mg  BID and rizatriptan as needed. She is doing well. She has about 3-5 migraines per month. Rizatriptan works well for abortive therapy. Rest and dark room also helps.   10/31/2020 ALL: Ashley Lucero is a 41 y.o. female here today for follow up for migraines. She continues topiramate 100mg  BID and rizatriptan as needed. She has about 2-3 migraines per month, easily aborted. She is now on norethindrone 5mg  and goserelin injections s/p hysterectomy with right oophorectomy in 08/2020. She was having significant pain and found to have endometriosis and ovarian cyst. She feels that she is doing fairly well and headaches are well managed.    HISTORY (copied from previous note)  UPDATE (11/01/19, VRP): Since last visit, doing well except slight increase in headaches in the last few months.  Averaging 1-2 headaches per week.  Tolerating medications topiramate 50 mg in a.m. and 100 mg in p.m.   PRIOR HPI (11/14/17): 41 year old female here for evaluation of headaches and migraines.  Patient has history of lichen sclerosis, irritable bowel syndrome, chronic low back pain.   For past 2 years patient reports new onset of retro-orbital headaches, pain, with pressure sensation, progressing to her entire head.  Headaches can last hours at a time.  She has at least 2 headaches per week.  Headache severity ranges from 8-9 out of 10 in severity.  She has associated nausea, photophobia, but no sensitivity to smells or sounds.  No specific triggering factors.  She is tried Tylenol and ice packs, laying down in a dark room with mild relief.  She was started on topiramate at least 1 year ago with mild benefit.  No family history of migraine.     REVIEW OF SYSTEMS: Out of a complete 14 system review of symptoms, the patient complains only of the following symptoms, headaches and all other reviewed systems are negative.   ALLERGIES: Allergies  Allergen Reactions   Prednisone Other (See Comments)    Vision difficulty     HOME MEDICATIONS: Outpatient Medications Prior to Visit  Medication Sig Dispense Refill   Clobetasol Propionate (CLOBETASOL 17 PROPIONATE) 0.5 % POWD by Does not apply route.     docusate sodium (COLACE) 50 MG capsule Take 50 mg by mouth daily.     DULoxetine (CYMBALTA) 60 MG capsule Take 60 mg by mouth daily.     loratadine (CLARITIN) 10 MG tablet Take 10 mg by mouth daily.     norethindrone (AYGESTIN) 5 MG tablet Take by mouth daily.     nortriptyline (PAMELOR) 10 MG capsule Take 10 mg by mouth at bedtime.     omeprazole (PRILOSEC) 20 MG capsule Take 20 mg by mouth daily.     Pimecrolimus (ELIDEL EX) Apply 30 g topically.     PREMARIN 0.625 MG tablet Take 0.625 mg by mouth daily.     rizatriptan (MAXALT-MLT) 10 MG disintegrating tablet Take 1 tablet (10 mg total) by mouth as needed for migraine. May repeat in 2 hours if needed 9 tablet 12   tiZANidine (ZANAFLEX) 4 MG tablet Take 4 mg by mouth every 8 (eight) hours as needed.     topiramate (TOPAMAX) 100 MG tablet Take 1 tablet (100 mg total) by mouth 2 (two) times  daily. 180 tablet 3   No facility-administered medications prior to visit.     PAST MEDICAL HISTORY: Past Medical History:  Diagnosis Date   Chronic back pain    GERD (gastroesophageal reflux disease)    IBS (irritable bowel syndrome)    Lichen sclerosus    Migraine    Neuropathy      PAST SURGICAL HISTORY: Past Surgical History:  Procedure Laterality Date   LUMBAR DISC SURGERY  11/30/2014   L5-S1     FAMILY HISTORY: Family History  Problem Relation Age of Onset   Diabetes Mother    Hypertension Maternal Grandmother    Thyroid disease Maternal Grandmother    Diabetes  Mellitus II Maternal Grandfather    Lung cancer Maternal Grandfather    CAD Paternal Grandmother    Heart disease Paternal Grandmother    Skin cancer Paternal Grandfather    Leukemia Paternal Grandfather      SOCIAL HISTORY: Social History   Socioeconomic History   Marital status: Married    Spouse name: Hydrographic surveyorAlicha   Number of children: 0   Years of education: 16 BS   Highest education level: Not on file  Occupational History    Comment: [employer]  Tobacco Use   Smoking status: Former    Types: Cigarettes    Quit date: 11/14/2012    Years since quitting: 9.9   Smokeless tobacco: Never  Substance and Sexual Activity   Alcohol use: Yes    Comment: 2-3 weekly   Drug use: No   Sexual activity: Not on file  Other Topics Concern   Not on file  Social History Narrative   Married   caffeine coffee, 3 cups daily   Social Determinants of Health   Financial Resource Strain: Not on file  Food Insecurity: Not on file  Transportation Needs: Not on file  Physical Activity: Not on file  Stress: Not on file  Social Connections: Not on file  Intimate Partner Violence: Not on file      PHYSICAL EXAM  There were no vitals filed for this visit.   There is no height or weight on file to calculate BMI.   Generalized: Well developed, in no acute distress  Cardiology: normal rate and rhythm, no murmur auscultated  Respiratory: clear to auscultation bilaterally    Neurological examination  Mentation: Alert oriented to time, place, history taking. Follows all commands speech and language fluent Cranial nerve II-XII: Pupils were equal round reactive to light. Extraocular movements were full, visual field were full on confrontational test. Facial sensation and strength were normal. Head turning and shoulder shrug  were normal and symmetric. Motor: The motor testing reveals 5 over 5 strength of all 4 extremities. Good symmetric motor tone is noted throughout.  Gait and station: Gait is  normal.     DIAGNOSTIC DATA (LABS, IMAGING, TESTING) - I reviewed patient records, labs, notes, testing and imaging myself where available.  No results found for: "WBC", "HGB", "HCT", "MCV", "PLT" No results found for: "NA", "K", "CL", "CO2", "GLUCOSE", "BUN", "CREATININE", "CALCIUM", "PROT", "ALBUMIN", "AST", "ALT", "ALKPHOS", "BILITOT", "GFRNONAA", "GFRAA" No results found for: "CHOL", "HDL", "LDLCALC", "LDLDIRECT", "TRIG", "CHOLHDL" No results found for: "HGBA1C" No results found for: "VITAMINB12" No results found for: "TSH"    ASSESSMENT AND PLAN  41 y.o. year old female  has a past medical history of Chronic back pain, GERD (gastroesophageal reflux disease), IBS (irritable bowel syndrome), Lichen sclerosus, Migraine, and Neuropathy. here with   No diagnosis found.  Cledith continues to do very well on topiramate 100 mg twice daily and rizatriptan as needed.  We will continue current treatment plan.  She will monitor symptoms very closely and let me know if headaches worsen.  She will continue close follow-up with primary care and gynecology.  She will follow-up with me in 1 year, sooner if needed.  She verbalizes understanding and agreement with this plan.   Shawnie Dapper, MSN, FNP-C 10/30/2022, 1:20 PM  [facility] [address] [place], [place] [address] [phone]

## 2022-10-30 NOTE — Patient Instructions (Signed)
Below is our plan:  We will continue topiramate 100mg  twice daily and rizatriptan as needed.   Please make sure you are staying well hydrated. I recommend 50-60 ounces daily. Well balanced diet and regular exercise encouraged. Consistent sleep schedule with 6-8 hours recommended.   Please continue follow up with care team as directed.   Follow up with me in 1 year, may follow up with PCP if you wish   You may receive a survey regarding today's visit. I encourage you to leave honest feed back as I do use this information to improve patient care. Thank you for seeing me today!

## 2022-10-31 ENCOUNTER — Encounter: Payer: Self-pay | Admitting: Family Medicine

## 2022-10-31 ENCOUNTER — Ambulatory Visit: Payer: Federal, State, Local not specified - PPO | Admitting: Family Medicine

## 2022-10-31 VITALS — BP 127/91 | HR 91 | Ht 69.0 in | Wt 215.0 lb

## 2022-10-31 DIAGNOSIS — G43009 Migraine without aura, not intractable, without status migrainosus: Secondary | ICD-10-CM | POA: Diagnosis not present

## 2022-10-31 MED ORDER — TOPIRAMATE 100 MG PO TABS: 100.0000 mg | ORAL_TABLET | Freq: Two times a day (BID) | ORAL | 3 refills | Status: DC

## 2022-10-31 MED ORDER — RIZATRIPTAN BENZOATE 10 MG PO TBDP: 10.0000 mg | ORAL_TABLET | ORAL | 12 refills | Status: DC | PRN

## 2023-01-31 HISTORY — PX: KNEE SURGERY: SHX244

## 2023-09-02 HISTORY — PX: SHOULDER SURGERY: SHX246

## 2023-11-04 ENCOUNTER — Other Ambulatory Visit: Payer: Self-pay

## 2023-11-04 MED ORDER — TOPIRAMATE 100 MG PO TABS: 100.0000 mg | ORAL_TABLET | Freq: Two times a day (BID) | ORAL | 0 refills | Status: DC

## 2023-11-05 NOTE — Progress Notes (Unsigned)
No chief complaint on file.   HISTORY OF PRESENT ILLNESS:  11/05/23 ALL: [name] returns for follow up for migraines. She continues topiramate 100mg  BID and rizatriptan as needed.   10/31/2022 ALL:  Ashley Lucero returns for follow up for migraines. She continues topiramate 100mg  BID and rizatriptan as needed. She feels migraines are well managed. She continues to have 3-4 migraines per month. No obvious triggers. Rizatriptan works well.   10/31/2021 ALL: Ashley Lucero returns for follow up for migraines. She continues topiramate 100mg  BID and rizatriptan as needed. She is doing well. She has about 3-5 migraines per month. Rizatriptan works well for abortive therapy. Rest and dark room also helps.   10/31/2020 ALL: Ashley Lucero is a 42 y.o. female here today for follow up for migraines. She continues topiramate 100mg  BID and rizatriptan as needed. She has about 2-3 migraines per month, easily aborted. She is now on norethindrone 5mg  and goserelin injections s/p hysterectomy with right oophorectomy in 08/2020. She was having significant pain and found to have endometriosis and ovarian cyst. She feels that she is doing fairly well and headaches are well managed.    HISTORY (copied from previous note)  UPDATE (11/01/19, VRP): Since last visit, doing well except slight increase in headaches in the last few months.  Averaging 1-2 headaches per week.  Tolerating medications topiramate 50 mg in a.m. and 100 mg in p.m.   PRIOR HPI (11/14/17): 42 year old female here for evaluation of headaches and migraines.  Patient has history of lichen sclerosis, irritable bowel syndrome, chronic low back pain.   For past 2 years patient reports new onset of retro-orbital headaches, pain, with pressure sensation, progressing to her entire head.  Headaches can last hours at a time.  She has at least 2 headaches per week.  Headache severity ranges from 8-9 out of 10 in severity.  She has associated nausea, photophobia,  but no sensitivity to smells or sounds.  No specific triggering factors.  She is tried Tylenol and ice packs, laying down in a dark room with mild relief.  She was started on topiramate at least 1 year ago with mild benefit.  No family history of migraine.    REVIEW OF SYSTEMS: Out of a complete 14 system review of symptoms, the patient complains only of the following symptoms, headaches and all other reviewed systems are negative.   ALLERGIES: Allergies  Allergen Reactions   Prednisone Other (See Comments)    Vision difficulty     HOME MEDICATIONS: Outpatient Medications Prior to Visit  Medication Sig Dispense Refill   Clobetasol Propionate (CLOBETASOL 17 PROPIONATE) 0.5 % POWD by Does not apply route.     docusate sodium (COLACE) 50 MG capsule Take 50 mg by mouth daily.     DULoxetine (CYMBALTA) 60 MG capsule Take 60 mg by mouth daily.     loratadine (CLARITIN) 10 MG tablet Take 10 mg by mouth daily.     norethindrone (AYGESTIN) 5 MG tablet Take by mouth daily.     nortriptyline (PAMELOR) 10 MG capsule Take 10 mg by mouth at bedtime.     omeprazole (PRILOSEC) 20 MG capsule Take 20 mg by mouth daily.     Pimecrolimus (ELIDEL EX) Apply 30 g topically.     PREMARIN 0.625 MG tablet Take 0.625 mg by mouth daily.     rizatriptan (MAXALT-MLT) 10 MG disintegrating tablet Take 1 tablet (10 mg total) by mouth as needed for migraine. May repeat in 2 hours if needed  9 tablet 12   tiZANidine (ZANAFLEX) 4 MG tablet Take 4 mg by mouth every 8 (eight) hours as needed.     topiramate (TOPAMAX) 100 MG tablet Take 1 tablet (100 mg total) by mouth 2 (two) times daily. 180 tablet 0   No facility-administered medications prior to visit.     PAST MEDICAL HISTORY: Past Medical History:  Diagnosis Date   Chronic back pain    GERD (gastroesophageal reflux disease)    IBS (irritable bowel syndrome)    Lichen sclerosus    Migraine    Neuropathy      PAST SURGICAL HISTORY: Past Surgical History:   Procedure Laterality Date   LUMBAR DISC SURGERY  11/30/2014   L5-S1     FAMILY HISTORY: Family History  Problem Relation Age of Onset   Diabetes Mother    Hypertension Maternal Grandmother    Thyroid disease Maternal Grandmother    Diabetes Mellitus II Maternal Grandfather    Lung cancer Maternal Grandfather    CAD Paternal Grandmother    Heart disease Paternal Grandmother    Skin cancer Paternal Grandfather    Leukemia Paternal Grandfather    Migraines Neg Hx      SOCIAL HISTORY: Social History   Socioeconomic History   Marital status: Married    Spouse name: Hydrographic surveyor   Number of children: 0   Years of education: 16 BS   Highest education level: Not on file  Occupational History    Comment: [employer]  Tobacco Use   Smoking status: Former    Current packs/day: 0.00    Types: Cigarettes    Quit date: 11/14/2012    Years since quitting: 10.9   Smokeless tobacco: Never  Substance and Sexual Activity   Alcohol use: Yes    Comment: occ beer   Drug use: No   Sexual activity: Not on file  Other Topics Concern   Not on file  Social History Narrative   Married   caffeine coffee, 3 cups daily   Social Determinants of Health   Financial Resource Strain: Not on file  Food Insecurity: Not on file  Transportation Needs: Not on file  Physical Activity: Not on file  Stress: Not on file  Social Connections: Not on file  Intimate Partner Violence: Not on file      PHYSICAL EXAM  There were no vitals filed for this visit.    There is no height or weight on file to calculate BMI.   Generalized: Well developed, in no acute distress  Cardiology: normal rate and rhythm, no murmur auscultated  Respiratory: clear to auscultation bilaterally    Neurological examination  Mentation: Alert oriented to time, place, history taking. Follows all commands speech and language fluent Cranial nerve II-XII: Pupils were equal round reactive to light. Extraocular movements were full,  visual field were full on confrontational test. Facial sensation and strength were normal. Head turning and shoulder shrug  were normal and symmetric. Motor: The motor testing reveals 5 over 5 strength of all 4 extremities. Good symmetric motor tone is noted throughout.  Gait and station: Gait is normal.     DIAGNOSTIC DATA (LABS, IMAGING, TESTING) - I reviewed patient records, labs, notes, testing and imaging myself where available.  No results found for: "WBC", "HGB", "HCT", "MCV", "PLT" No results found for: "NA", "K", "CL", "CO2", "GLUCOSE", "BUN", "CREATININE", "CALCIUM", "PROT", "ALBUMIN", "AST", "ALT", "ALKPHOS", "BILITOT", "GFRNONAA", "GFRAA" No results found for: "CHOL", "HDL", "LDLCALC", "LDLDIRECT", "TRIG", "CHOLHDL" No results found for: "HGBA1C"  No results found for: "VITAMINB12" No results found for: "TSH"    ASSESSMENT AND PLAN  42 y.o. year old female  has a past medical history of Chronic back pain, GERD (gastroesophageal reflux disease), IBS (irritable bowel syndrome), Lichen sclerosus, Migraine, and Neuropathy. here with   No diagnosis found.  Daizie continues to do very well on topiramate 100 mg twice daily and rizatriptan as needed.  We will continue current treatment plan.  She will monitor symptoms very closely and let me know if headaches worsen.  She will continue close follow-up with primary care and gynecology.  She will follow-up with me in 1 year, sooner if needed.  She verbalizes understanding and agreement with this plan.   No orders of the defined types were placed in this encounter.    Shawnie Dapper, MSN, FNP-C 11/05/2023, 1:51 PM  [facility] [address] [place], [place] [address] [phone]

## 2023-11-05 NOTE — Patient Instructions (Incomplete)

## 2023-11-06 ENCOUNTER — Ambulatory Visit: Payer: Federal, State, Local not specified - PPO | Admitting: Family Medicine

## 2023-11-06 DIAGNOSIS — G43009 Migraine without aura, not intractable, without status migrainosus: Secondary | ICD-10-CM

## 2023-11-13 NOTE — Patient Instructions (Signed)
Below is our plan:  We will continue topiramate 100mg  twice daily and rizatriptan as needed   Please make sure you are staying well hydrated. I recommend 50-60 ounces daily. Well balanced diet and regular exercise encouraged. Consistent sleep schedule with 6-8 hours recommended.   Please continue follow up with care team as directed.   Follow up with me in 1 year   You may receive a survey regarding today's visit. I encourage you to leave honest feed back as I do use this information to improve patient care. Thank you for seeing me today!   GENERAL HEADACHE INFORMATION:   Natural supplements: Magnesium Oxide or Magnesium Glycinate 500 mg at bed (up to 800 mg daily) Coenzyme Q10 300 mg in AM Vitamin B2- 200 mg twice a day   Add 1 supplement at a time since even natural supplements can have undesirable side effects. You can sometimes buy supplements cheaper (especially Coenzyme Q10) at www.WebmailGuide.co.za or at [facility].  Migraine with aura: There is increased risk for stroke in women with migraine with aura and a contraindication for the combined contraceptive pill for use by women who have migraine with aura. The risk for women with migraine without aura is lower. However other risk factors like smoking are far more likely to increase stroke risk than migraine. There is a recommendation for no smoking and for the use of OCPs without estrogen such as progestogen only pills particularly for women with migraine with aura.Marland Kitchen People who have migraine headaches with auras may be 3 times more likely to have a stroke caused by a blood clot, compared to migraine patients who don't see auras. Women who take hormone-replacement therapy may be 30 percent more likely to suffer a clot-based stroke than women not taking medication containing estrogen. Other risk factors like smoking and high blood pressure may be  much more important.    Vitamins and herbs that show potential:   Magnesium: Magnesium (250 mg twice  a day or 500 mg at bed) has a relaxant effect on smooth muscles such as blood vessels. Individuals suffering from frequent or daily headache usually have low magnesium levels which can be increase with daily supplementation of 400-750 mg. Three trials found 40-90% average headache reduction  when used as a preventative. Magnesium may help with headaches are aura, the best evidence for magnesium is for migraine with aura is its thought to stop the cortical spreading depression we believe is the pathophysiology of migraine aura.Magnesium also demonstrated the benefit in menstrually related migraine.  Magnesium is part of the messenger system in the serotonin cascade and it is a good muscle relaxant.  It is also useful for constipation which can be a side effect of other medications used to treat migraine. Good sources include nuts, whole grains, and tomatoes. Side Effects: loose stool/diarrhea  Riboflavin (vitamin B 2) 200 mg twice a day. This vitamin assists nerve cells in the production of ATP a principal energy storing molecule.  It is necessary for many chemical reactions in the body.  There have been at least 3 clinical trials of riboflavin using 400 mg per day all of which suggested that migraine frequency can be decreased.  All 3 trials showed significant improvement in over half of migraine sufferers.  The supplement is found in bread, cereal, milk, meat, and poultry.  Most Americans get more riboflavin than the recommended daily allowance, however riboflavin deficiency is not necessary for the supplements to help prevent headache. Side effects: energizing, green urine  Coenzyme Q10: This is present in almost all cells in the body and is critical component for the conversion of energy.  Recent studies have shown that a nutritional supplement of CoQ10 can reduce the frequency of migraine attacks by improving the energy production of cells as with riboflavin.  Doses of 150 mg twice a day have been shown to  be effective.   Melatonin: Increasing evidence shows correlation between melatonin secretion and headache conditions.  Melatonin supplementation has decreased headache intensity and duration.  It is widely used as a sleep aid.  Sleep is natures way of dealing with migraine.  A dose of 3 mg is recommended to start for headaches including cluster headache. Higher doses up to 15 mg has been reviewed for use in Cluster headache and have been used. The rationale behind using melatonin for cluster is that many theories regarding the cause of Cluster headache center around the disruption of the normal circadian rhythm in the brain.  This helps restore the normal circadian rhythm.   HEADACHE DIET: Foods and beverages which may trigger migraine Note that only 20% of headache patients are food sensitive. You will know if you are food sensitive if you get a headache consistently 20 minutes to 2 hours after eating a certain food. Only cut out a food if it causes headaches, otherwise you might remove foods you enjoy! What matters most for diet is to eat a well balanced healthy diet full of vegetables and low fat protein, and to not miss meals.   Chocolate, other sweets ALL cheeses except cottage and cream cheese Dairy products, yogurt, sour cream, ice cream Liver Meat extracts (Bovril, Marmite, meat tenderizers) Meats or fish which have undergone aging, fermenting, pickling or smoking. These include: Hotdogs,salami,Lox,sausage, mortadellas,smoked salmon, pepperoni, Pickled herring Pods of broad bean (English beans, Chinese pea pods, [place] (fava) beans, lima and navy beans Ripe avocado, ripe banana Yeast extracts or active yeast preparations such as Brewer's or Fleishman's (commercial bakes goods are permitted) Tomato based foods, pizza (lasagna, etc.)   MSG (monosodium glutamate) is disguised as many things; look for these common aliases: Monopotassium glutamate Autolysed yeast Hydrolysed protein Sodium  caseinate "flavorings" "all natural preservatives" Nutrasweet   Avoid all other foods that convincingly provoke headaches.   Resources: The Dizzy [place] Laundry Your Headache Diet, migrainestrong.com  https://zamora-andrews.com/   Caffeine and Migraine For patients that have migraine, caffeine intake more than 3 days per week can lead to dependency and increased migraine frequency. I would recommend cutting back on your caffeine intake as best you can. The recommended amount of caffeine is 200-300 mg daily, although migraine patients may experience dependency at even lower doses. While you may notice an increase in headache temporarily, cutting back will be helpful for headaches in the long run. For more information on caffeine and migraine, visit: https://americanmigrainefoundation.org/resource-library/caffeine-and-migraine/   Headache Prevention Strategies:   1. Maintain a headache diary; learn to identify and avoid triggers.  - This can be a simple note where you log when you had a headache, associated symptoms, and medications used - There are several smartphone apps developed to help track migraines: Migraine Buddy, Migraine Monitor, Curelator N1-Headache App   Common triggers include: Emotional triggers: Emotional/Upset family or friends Emotional/Upset occupation Business reversal/success Anticipation anxiety Crisis-serious Post-crisis periodNew job/position   Physical triggers: Vacation Day Weekend Strenuous Exercise High Altitude Location New Move Menstrual Day Physical Illness Oversleep/Not enough sleep Weather changes Light: Photophobia or light sesnitivity treatment involves a balance between desensitization and reduction  in overly strong input. Use dark polarized glasses outside, but not inside. Avoid bright or fluorescent light, but do not dim environment to the point that going into a normally lit room hurts. Consider  FL-41 tint lenses, which reduce the most irritating wavelengths without blocking too much light.  These can be obtained at axonoptics.com or theraspecs.com Foods: see list above.   2. Limit use of acute treatments (over-the-counter medications, triptans, etc.) to no more than 2 days per week or 10 days per month to prevent medication overuse headache (rebound headache).     3. Follow a regular schedule (including weekends and holidays): Don't skip meals. Eat a balanced diet. 8 hours of sleep nightly. Minimize stress. Exercise 30 minutes per day. Being overweight is associated with a 5 times increased risk of chronic migraine. Keep well hydrated and drink 6-8 glasses of water per day.   4. Initiate non-pharmacologic measures at the earliest onset of your headache. Rest and quiet environment. Relax and reduce stress. Breathe2Relax is a free app that can instruct you on    some simple relaxtion and breathing techniques. Http://Dawnbuse.com is a    free website that provides teaching videos on relaxation.  Also, there are  many apps that   can be downloaded for "mindful" relaxation.  An app called YOGA NIDRA will help walk you through mindfulness. Another app called Calm can be downloaded to give you a structured mindfulness guide with daily reminders and skill development. Headspace for guided meditation Mindfulness Based Stress Reduction Online Course: www.palousemindfulness.com Cold compresses.   5. Don't wait!! Take the maximum allowable dosage of prescribed medication at the first sign of migraine.   6. Compliance:  Take prescribed medication regularly as directed and at the first sign of a migraine.   7. Communicate:  Call your physician when problems arise, especially if your headaches change, increase in frequency/severity, or become associated with neurological symptoms (weakness, numbness, slurred speech, etc.). Proceed to emergency room if you experience new or worsening symptoms or  symptoms do not resolve, if you have new neurologic symptoms or if headache is severe, or for any concerning symptom.   8. Headache/pain management therapies: Consider various complementary methods, including medication, behavioral therapy, psychological counselling, biofeedback, massage therapy, acupuncture, dry needling, and other modalities.  Such measures may reduce the need for medications. Counseling for pain management, where patients learn to function and ignore/minimize their pain, seems to work very well.   9. Recommend changing family's attention and focus away from patient's headaches. Instead, emphasize daily activities. If first question of day is 'How are your headaches/Do you have a headache today?', then patient will constantly think about headaches, thus making them worse. Goal is to re-direct attention away from headaches, toward daily activities and other distractions.   10. Helpful Websites: www.AmericanHeadacheSociety.org PatentHood.ch www.headaches.org TightMarket.nl www.achenet.org

## 2023-11-13 NOTE — Progress Notes (Signed)
Chief Complaint  Ashley Lucero presents with   Room 2    Pt is here Alone. Pt states that things have been going good with her Migraines. Pt denies any new questions or concerns to discuss today.     HISTORY OF PRESENT ILLNESS:  11/17/23 ALL: Ashley Lucero returns for follow up for migraines. She continues topiramate 100mg  BID and rizatriptan as needed. Migraines remain well managed. She may have 3-4 per month. Rizatriptan works well.   10/31/2022 ALL:  Ashley Lucero returns for follow up for migraines. She continues topiramate 100mg  BID and rizatriptan as needed. She feels migraines are well managed. She continues to have 3-4 migraines per month. No obvious triggers. Rizatriptan works well.   10/31/2021 ALL: Ashley Lucero returns for follow up for migraines. She continues topiramate 100mg  BID and rizatriptan as needed. She is doing well. She has about 3-5 migraines per month. Rizatriptan works well for abortive therapy. Rest and dark room also helps.   10/31/2020 ALL: Ashley Lucero is a 42 y.o. female here today for follow up for migraines. She continues topiramate 100mg  BID and rizatriptan as needed. She has about 2-3 migraines per month, easily aborted. She is now on norethindrone 5mg  and goserelin injections s/p hysterectomy with right oophorectomy in 08/2020. She was having significant pain and found to have endometriosis and ovarian cyst. She feels that she is doing fairly well and headaches are well managed.    HISTORY (copied from previous note)  UPDATE (11/01/19, VRP): Since last visit, doing well except slight increase in headaches in the last few months.  Averaging 1-2 headaches per week.  Tolerating medications topiramate 50 mg in a.m. and 100 mg in p.m.   PRIOR HPI (11/14/17): 42 year old female here for evaluation of headaches and migraines.  Ashley Lucero has history of lichen sclerosis, irritable bowel syndrome, chronic low back pain.   For past 2 years Ashley Lucero reports new onset of retro-orbital  headaches, pain, with pressure sensation, progressing to her entire head.  Headaches can last hours at a time.  She has at least 2 headaches per week.  Headache severity ranges from 8-9 out of 10 in severity.  She has associated nausea, photophobia, but no sensitivity to smells or sounds.  No specific triggering factors.  She is tried Tylenol and ice packs, laying down in a dark room with mild relief.  She was started on topiramate at least 1 year ago with mild benefit.  No family history of migraine.    REVIEW OF SYSTEMS: Out of a complete 14 system review of symptoms, the Ashley Lucero complains only of the following symptoms, headaches and all other reviewed systems are negative.   ALLERGIES: Allergies  Allergen Reactions   Prednisone Other (See Comments)    Vision difficulty     HOME MEDICATIONS: Outpatient Medications Prior to Visit  Medication Sig Dispense Refill   Clobetasol Propionate (CLOBETASOL 17 PROPIONATE) 0.5 % POWD by Does not apply route.     docusate sodium (COLACE) 50 MG capsule Take 50 mg by mouth daily.     DULoxetine (CYMBALTA) 60 MG capsule Take 60 mg by mouth daily.     loratadine (CLARITIN) 10 MG tablet Take 10 mg by mouth daily.     norethindrone (AYGESTIN) 5 MG tablet Take by mouth daily.     omeprazole (PRILOSEC) 20 MG capsule Take 20 mg by mouth daily.     Pimecrolimus (ELIDEL EX) Apply 30 g topically.     PREMARIN 0.625 MG tablet Take 0.625 mg  by mouth daily.     tiZANidine (ZANAFLEX) 4 MG tablet Take 4 mg by mouth every 8 (eight) hours as needed.     nortriptyline (PAMELOR) 10 MG capsule Take 10 mg by mouth at bedtime. (Ashley Lucero not taking: Reported on 11/17/2023)     rizatriptan (MAXALT-MLT) 10 MG disintegrating tablet Take 1 tablet (10 mg total) by mouth as needed for migraine. May repeat in 2 hours if needed 9 tablet 12   topiramate (TOPAMAX) 100 MG tablet Take 1 tablet (100 mg total) by mouth 2 (two) times daily. 180 tablet 0   No facility-administered  medications prior to visit.     PAST MEDICAL HISTORY: Past Medical History:  Diagnosis Date   Chronic back pain    GERD (gastroesophageal reflux disease)    IBS (irritable bowel syndrome)    Lichen sclerosus    Migraine    Neuropathy      PAST SURGICAL HISTORY: Past Surgical History:  Procedure Laterality Date   KNEE SURGERY  01/2023   LUMBAR DISC SURGERY  11/30/2014   L5-S1   SHOULDER SURGERY Right 09/2023     FAMILY HISTORY: Family History  Problem Relation Age of Onset   Diabetes Mother    Hypertension Maternal Grandmother    Thyroid disease Maternal Grandmother    Diabetes Mellitus II Maternal Grandfather    Lung cancer Maternal Grandfather    CAD Paternal Grandmother    Heart disease Paternal Grandmother    Skin cancer Paternal Grandfather    Leukemia Paternal Grandfather    Migraines Neg Hx      SOCIAL HISTORY: Social History   Socioeconomic History   Marital status: Married    Spouse name: Hydrographic surveyor   Number of children: 0   Years of education: 16 BS   Highest education level: Not on file  Occupational History    Comment: [employer]  Tobacco Use   Smoking status: Former    Current packs/day: 0.00    Types: Cigarettes    Quit date: 11/14/2012    Years since quitting: 11.0   Smokeless tobacco: Never  Substance and Sexual Activity   Alcohol use: Yes    Comment: occ beer   Drug use: No   Sexual activity: Not on file  Other Topics Concern   Not on file  Social History Narrative   Married   caffeine coffee, 3 cups daily   Social Drivers of Corporate investment banker Strain: Not on file  Food Insecurity: Not on file  Transportation Needs: Not on file  Physical Activity: Not on file  Stress: Not on file  Social Connections: Not on file  Intimate Partner Violence: Not on file      PHYSICAL EXAM  Vitals:   11/17/23 0911  BP: 116/85  Pulse: 74  Weight: 223 lb 8 oz (101.4 kg)  Height: 5\' 9"  (1.753 m)      Body mass index is 33.01  kg/m.   Generalized: Well developed, in no acute distress  Cardiology: normal rate and rhythm, no murmur auscultated  Respiratory: clear to auscultation bilaterally    Neurological examination  Mentation: Alert oriented to time, place, history taking. Follows all commands speech and language fluent Cranial nerve II-XII: Pupils were equal round reactive to light. Extraocular movements were full, visual field were full on confrontational test. Facial sensation and strength were normal. Head turning and shoulder shrug  were normal and symmetric. Motor: The motor testing reveals 5 over 5 strength of all 4 extremities. Good  symmetric motor tone is noted throughout.  Gait and station: Gait is normal.     DIAGNOSTIC DATA (LABS, IMAGING, TESTING) - I reviewed Ashley Lucero records, labs, notes, testing and imaging myself where available.  No results found for: "WBC", "HGB", "HCT", "MCV", "PLT" No results found for: "NA", "K", "CL", "CO2", "GLUCOSE", "BUN", "CREATININE", "CALCIUM", "PROT", "ALBUMIN", "AST", "ALT", "ALKPHOS", "BILITOT", "GFRNONAA", "GFRAA" No results found for: "CHOL", "HDL", "LDLCALC", "LDLDIRECT", "TRIG", "CHOLHDL" No results found for: "HGBA1C" No results found for: "VITAMINB12" No results found for: "TSH"    ASSESSMENT AND PLAN  42 y.o. year old female  has a past medical history of Chronic back pain, GERD (gastroesophageal reflux disease), IBS (irritable bowel syndrome), Lichen sclerosus, Migraine, and Neuropathy. here with   Migraine without aura and without status migrainosus, not intractable  Ashley Lucero continues to do very well on topiramate 100 mg twice daily and rizatriptan as needed.  We will continue current treatment plan.  She will monitor symptoms very closely and let me know if headaches worsen.  She will continue close follow-up with primary care and gynecology.  She will follow-up with me in 1 year, sooner if needed.  She verbalizes understanding and agreement with  this plan.   Meds ordered this encounter  Medications   topiramate (TOPAMAX) 100 MG tablet    Sig: Take 1 tablet (100 mg total) by mouth 2 (two) times daily.    Dispense:  180 tablet    Refill:  3    Supervising Provider:   Anson Fret [[id number]]   rizatriptan (MAXALT-MLT) 10 MG disintegrating tablet    Sig: Take 1 tablet (10 mg total) by mouth as needed for migraine. May repeat in 2 hours if needed    Dispense:  9 tablet    Refill:  12    Supervising Provider:   Anson Fret [[id number]]     Shawnie Dapper, MSN, FNP-C 11/17/2023, 9:23 AM  [facility] [address], [address] [place], [place] [address] [phone]

## 2023-11-17 ENCOUNTER — Encounter: Payer: Self-pay | Admitting: Family Medicine

## 2023-11-17 ENCOUNTER — Ambulatory Visit: Payer: Federal, State, Local not specified - PPO | Admitting: Family Medicine

## 2023-11-17 VITALS — BP 116/85 | HR 74 | Ht 69.0 in | Wt 223.5 lb

## 2023-11-17 DIAGNOSIS — G43009 Migraine without aura, not intractable, without status migrainosus: Secondary | ICD-10-CM | POA: Diagnosis not present

## 2023-11-17 MED ORDER — TOPIRAMATE 100 MG PO TABS: 100.0000 mg | ORAL_TABLET | Freq: Two times a day (BID) | ORAL | 3 refills | Status: DC

## 2023-11-17 MED ORDER — RIZATRIPTAN BENZOATE 10 MG PO TBDP: 10.0000 mg | ORAL_TABLET | ORAL | 12 refills | Status: DC | PRN

## 2024-11-17 NOTE — Progress Notes (Unsigned)
 No chief complaint on file.   HISTORY OF PRESENT ILLNESS:  11/17/2024 ALL: Ashley Lucero returns for follow up for migraines. She continues topiramate  100mg  twice daily and rizatriptan  as needed.   11/17/2023 ALL:  Ashley Lucero returns for follow up for migraines. She continues topiramate  100mg  BID and rizatriptan  as needed. Migraines remain well managed. She may have 3-4 per month. Rizatriptan  works well.   10/31/2022 ALL:  Ashley Lucero returns for follow up for migraines. She continues topiramate  100mg  BID and rizatriptan  as needed. She feels migraines are well managed. She continues to have 3-4 migraines per month. No obvious triggers. Rizatriptan  works well.   10/31/2021 ALL: Ashley Lucero returns for follow up for migraines. She continues topiramate  100mg  BID and rizatriptan  as needed. She is doing well. She has about 3-5 migraines per month. Rizatriptan  works well for abortive therapy. Rest and dark room also helps.   10/31/2020 ALL: Ashley Lucero is a 43 y.o. female here today for follow up for migraines. She continues topiramate  100mg  BID and rizatriptan  as needed. She has about 2-3 migraines per month, easily aborted. She is now on norethindrone 5mg  and goserelin injections s/p hysterectomy with right oophorectomy in 08/2020. She was having significant pain and found to have endometriosis and ovarian cyst. She feels that she is doing fairly well and headaches are well managed.    HISTORY (copied from previous note)  UPDATE (11/01/19, VRP): Since last visit, doing well except slight increase in headaches in the last few months.  Averaging 1-2 headaches per week.  Tolerating medications topiramate  50 mg in a.m. and 100 mg in p.m.   PRIOR HPI (11/14/17): 43 year old female here for evaluation of headaches and migraines.  Patient has history of lichen sclerosis, irritable bowel syndrome, chronic low back pain.   For past 2 years patient reports new onset of retro-orbital headaches, pain, with  pressure sensation, progressing to her entire head.  Headaches can last hours at a time.  She has at least 2 headaches per week.  Headache severity ranges from 8-9 out of 10 in severity.  She has associated nausea, photophobia, but no sensitivity to smells or sounds.  No specific triggering factors.  She is tried Tylenol and ice packs, laying down in a dark room with mild relief.  She was started on topiramate  at least 1 year ago with mild benefit.  No family history of migraine.    REVIEW OF SYSTEMS: Out of a complete 14 system review of symptoms, the patient complains only of the following symptoms, headaches and all other reviewed systems are negative.   ALLERGIES: Allergies  Allergen Reactions   Prednisone Other (See Comments)    Vision difficulty     HOME MEDICATIONS: Outpatient Medications Prior to Visit  Medication Sig Dispense Refill   Clobetasol Propionate (CLOBETASOL 17 PROPIONATE) 0.5 % POWD by Does not apply route.     docusate sodium (COLACE) 50 MG capsule Take 50 mg by mouth daily.     DULoxetine (CYMBALTA) 60 MG capsule Take 60 mg by mouth daily.     loratadine (CLARITIN) 10 MG tablet Take 10 mg by mouth daily.     norethindrone (AYGESTIN) 5 MG tablet Take by mouth daily.     omeprazole (PRILOSEC) 20 MG capsule Take 20 mg by mouth daily.     Pimecrolimus (ELIDEL EX) Apply 30 g topically.     PREMARIN 0.625 MG tablet Take 0.625 mg by mouth daily.     rizatriptan  (MAXALT -MLT) 10 MG disintegrating tablet  Take 1 tablet (10 mg total) by mouth as needed for migraine. May repeat in 2 hours if needed 9 tablet 12   tiZANidine (ZANAFLEX) 4 MG tablet Take 4 mg by mouth every 8 (eight) hours as needed.     topiramate  (TOPAMAX ) 100 MG tablet Take 1 tablet (100 mg total) by mouth 2 (two) times daily. 180 tablet 3   No facility-administered medications prior to visit.     PAST MEDICAL HISTORY: Past Medical History:  Diagnosis Date   Chronic back pain    GERD (gastroesophageal  reflux disease)    IBS (irritable bowel syndrome)    Lichen sclerosus    Migraine    Neuropathy      PAST SURGICAL HISTORY: Past Surgical History:  Procedure Laterality Date   KNEE SURGERY  01/2023   LUMBAR DISC SURGERY  11/30/2014   [id number]   SHOULDER SURGERY Right 09/2023     FAMILY HISTORY: Family History  Problem Relation Age of Onset   Diabetes Mother    Hypertension Maternal Grandmother    Thyroid disease Maternal Grandmother    Diabetes Mellitus II Maternal Grandfather    Lung cancer Maternal Grandfather    CAD Paternal Grandmother    Heart disease Paternal Grandmother    Skin cancer Paternal Grandfather    Leukemia Paternal Grandfather    Migraines Neg Hx      SOCIAL HISTORY: Social History   Socioeconomic History   Marital status: Married    Spouse name: Hydrographic Surveyor   Number of children: 0   Years of education: 16 BS   Highest education level: Not on file  Occupational History    Comment: [employer]  Tobacco Use   Smoking status: Former    Current packs/day: 0.00    Types: Cigarettes    Quit date: 11/14/2012    Years since quitting: 12.0   Smokeless tobacco: Never  Substance and Sexual Activity   Alcohol use: Yes    Comment: occ beer   Drug use: No   Sexual activity: Not on file  Other Topics Concern   Not on file  Social History Narrative   Married   caffeine coffee, 3 cups daily   Social Drivers of Health   Tobacco Use: Medium Risk (11/17/2023)   Patient History    Smoking Tobacco Use: Former    Smokeless Tobacco Use: Never    Passive Exposure: Not on Actuary Strain: Not on file  Food Insecurity: Not on file  Transportation Needs: Not on file  Physical Activity: Not on file  Stress: Not on file  Social Connections: Not on file  Intimate Partner Violence: Not on file  Depression (EYV7-0): Not on file  Alcohol Screen: Not on file  Housing: Not on file  Utilities: Not on file  Health Literacy: Not on file      PHYSICAL  EXAM  There were no vitals filed for this visit.     There is no height or weight on file to calculate BMI.   Generalized: Well developed, in no acute distress  Cardiology: normal rate and rhythm, no murmur auscultated  Respiratory: clear to auscultation bilaterally    Neurological examination  Mentation: Alert oriented to time, place, history taking. Follows all commands speech and language fluent Cranial nerve II-XII: Pupils were equal round reactive to light. Extraocular movements were full, visual field were full on confrontational test. Facial sensation and strength were normal. Head turning and shoulder shrug  were normal and symmetric. Motor: The  motor testing reveals 5 over 5 strength of all 4 extremities. Good symmetric motor tone is noted throughout.  Gait and station: Gait is normal.     DIAGNOSTIC DATA (LABS, IMAGING, TESTING) - I reviewed patient records, labs, notes, testing and imaging myself where available.  No results found for: WBC, HGB, HCT, MCV, PLT No results found for: NA, K, CL, CO2, GLUCOSE, BUN, CREATININE, CALCIUM, PROT, ALBUMIN, AST, ALT, ALKPHOS, BILITOT, GFRNONAA, GFRAA No results found for: CHOL, HDL, LDLCALC, LDLDIRECT, TRIG, CHOLHDL No results found for: YHAJ8R No results found for: VITAMINB12 No results found for: TSH    ASSESSMENT AND PLAN  43 y.o. year old female  has a past medical history of Chronic back pain, GERD (gastroesophageal reflux disease), IBS (irritable bowel syndrome), Lichen sclerosus, Migraine, and Neuropathy. here with   No diagnosis found.  Margurite continues to do very well on topiramate  100 mg twice daily and rizatriptan  as needed.  We will continue current treatment plan.  She will monitor symptoms very closely and let me know if headaches worsen.  She will continue close follow-up with primary care and gynecology.  She will follow-up with me in 1 year, sooner  if needed.  She verbalizes understanding and agreement with this plan.   No orders of the defined types were placed in this encounter.    Greig Forbes, MSN, FNP-C 11/17/2024, 4:45 PM  [facility] [address] [place], [place] [address] [phone]

## 2024-11-17 NOTE — Patient Instructions (Incomplete)

## 2024-11-22 ENCOUNTER — Encounter: Payer: Self-pay | Admitting: Family Medicine

## 2024-11-22 ENCOUNTER — Ambulatory Visit: Payer: Federal, State, Local not specified - PPO | Admitting: Family Medicine

## 2024-11-22 VITALS — BP 136/88 | HR 74 | Ht 69.0 in | Wt 216.0 lb

## 2024-11-22 DIAGNOSIS — G43009 Migraine without aura, not intractable, without status migrainosus: Secondary | ICD-10-CM

## 2024-11-22 MED ORDER — RIZATRIPTAN BENZOATE 10 MG PO TBDP: 10.0000 mg | ORAL_TABLET | ORAL | 12 refills | Status: AC | PRN

## 2024-11-22 MED ORDER — TOPIRAMATE 100 MG PO TABS: 100.0000 mg | ORAL_TABLET | Freq: Two times a day (BID) | ORAL | 3 refills | Status: AC

## 2025-11-29 ENCOUNTER — Ambulatory Visit: Admitting: Family Medicine
